# Patient Record
Sex: Male | Born: 1945
Health system: Southern US, Community
[De-identification: ages and names within clinical notes are randomized; demographics above are authoritative.]

## PROBLEM LIST (undated history)

## (undated) DIAGNOSIS — M1990 Unspecified osteoarthritis, unspecified site: Secondary | ICD-10-CM

## (undated) DIAGNOSIS — R42 Dizziness and giddiness: Secondary | ICD-10-CM

## (undated) DIAGNOSIS — I722 Aneurysm of renal artery: Secondary | ICD-10-CM

## (undated) DIAGNOSIS — Z9989 Dependence on other enabling machines and devices: Secondary | ICD-10-CM

## (undated) DIAGNOSIS — G4730 Sleep apnea, unspecified: Secondary | ICD-10-CM

## (undated) DIAGNOSIS — G4733 Obstructive sleep apnea (adult) (pediatric): Secondary | ICD-10-CM

## (undated) DIAGNOSIS — R7303 Prediabetes: Secondary | ICD-10-CM

## (undated) DIAGNOSIS — I10 Essential (primary) hypertension: Secondary | ICD-10-CM

## (undated) DIAGNOSIS — E785 Hyperlipidemia, unspecified: Secondary | ICD-10-CM

## (undated) DIAGNOSIS — I2510 Atherosclerotic heart disease of native coronary artery without angina pectoris: Secondary | ICD-10-CM

## (undated) HISTORY — DX: Essential (primary) hypertension: I10

## (undated) HISTORY — DX: Aneurysm of renal artery: I72.2

## (undated) HISTORY — PX: CORONARY STENT PLACEMENT: SHX1402

## (undated) HISTORY — PX: COLONOSCOPY: SHX174

## (undated) HISTORY — DX: Atherosclerotic heart disease of native coronary artery without angina pectoris: I25.10

## (undated) HISTORY — PX: WISDOM TOOTH EXTRACTION: SHX21

## (undated) HISTORY — DX: Sleep apnea, unspecified: G47.30

## (undated) HISTORY — DX: Dizziness and giddiness: R42

## (undated) HISTORY — DX: Obstructive sleep apnea (adult) (pediatric): G47.33

## (undated) HISTORY — PX: EYE SURGERY: SHX253

## (undated) HISTORY — DX: Dependence on other enabling machines and devices: Z99.89

## (undated) HISTORY — DX: Hyperlipidemia, unspecified: E78.5

---

## 1996-03-13 DIAGNOSIS — I2510 Atherosclerotic heart disease of native coronary artery without angina pectoris: Secondary | ICD-10-CM

## 1996-03-13 HISTORY — DX: Atherosclerotic heart disease of native coronary artery without angina pectoris: I25.10

## 2000-01-01 ENCOUNTER — Encounter: Payer: Self-pay | Admitting: Family Medicine

## 2000-01-01 ENCOUNTER — Encounter: Admission: RE | Admit: 2000-01-01 | Discharge: 2000-01-01 | Payer: Self-pay | Admitting: Family Medicine

## 2000-07-19 ENCOUNTER — Encounter: Payer: Self-pay | Admitting: Family Medicine

## 2000-07-19 ENCOUNTER — Encounter: Admission: RE | Admit: 2000-07-19 | Discharge: 2000-07-19 | Payer: Self-pay | Admitting: Family Medicine

## 2008-03-09 HISTORY — PX: DOPPLER ECHOCARDIOGRAPHY: SHX263

## 2008-03-09 HISTORY — PX: NM MYOCAR PERF WALL MOTION: HXRAD629

## 2008-07-13 DIAGNOSIS — G4733 Obstructive sleep apnea (adult) (pediatric): Secondary | ICD-10-CM

## 2008-07-13 DIAGNOSIS — I722 Aneurysm of renal artery: Secondary | ICD-10-CM

## 2008-07-13 HISTORY — DX: Aneurysm of renal artery: I72.2

## 2008-07-13 HISTORY — DX: Obstructive sleep apnea (adult) (pediatric): G47.33

## 2008-11-27 ENCOUNTER — Ambulatory Visit: Payer: Self-pay | Admitting: Vascular Surgery

## 2009-05-21 HISTORY — PX: OTHER SURGICAL HISTORY: SHX169

## 2010-04-04 ENCOUNTER — Encounter (INDEPENDENT_AMBULATORY_CARE_PROVIDER_SITE_OTHER): Payer: Self-pay | Admitting: *Deleted

## 2010-04-23 ENCOUNTER — Emergency Department (HOSPITAL_BASED_OUTPATIENT_CLINIC_OR_DEPARTMENT_OTHER): Admission: EM | Admit: 2010-04-23 | Discharge: 2010-04-23 | Payer: Self-pay | Admitting: Emergency Medicine

## 2010-05-08 ENCOUNTER — Ambulatory Visit: Payer: Self-pay | Admitting: Gastroenterology

## 2010-08-12 NOTE — Miscellaneous (Signed)
Summary: [facility] PV  Clinical Lists Changes   Reviewed pt's records.  Recall colon not due until 2013.  Colonoscopy for 05/19/2010 cancelled. Recall for 2013 ented into [facility]

## 2010-08-12 NOTE — Letter (Signed)
Summary: Pre Visit Letter Revised  [facility]  [address] [place], [place] [address]   Phone: [phone]  Fax: [phone]        04/04/2010 MRN: [id number] Evan [name] [address] [place], [place]  [address]             Procedure Date:  05/19/2010   Welcome to the Gastroenterology Division at [facility].    You are scheduled to see a nurse for your pre-procedure visit on 05/08/2010 at 8:30AM on the 3rd floor at [facility], [address]  We ask that you try to arrive at our office 15 minutes prior to your appointment time to allow for check-in.  Please take a minute to review the attached form.  If you answer "Yes" to one or more of the questions on the first page, we ask that you call the person listed at your earliest opportunity.  If you answer "No" to all of the questions, please complete the rest of the form and bring it to your appointment.    Your nurse visit will consist of discussing your medical and surgical history, your immediate family medical history, and your medications.   If you are unable to list all of your medications on the form, please bring the medication bottles to your appointment and we will list them.  We will need to be aware of both prescribed and over the counter drugs.  We will need to know exact dosage information as well.    Please be prepared to read and sign documents such as consent forms, a financial agreement, and acknowledgement forms.  If necessary, and with your consent, a friend or relative is welcome to sit-in on the nurse visit with you.  Please bring your insurance card so that we may make a copy of it.  If your insurance requires a referral to see a specialist, please bring your referral form from your primary care physician.  No co-pay is required for this nurse visit.     If you cannot keep your appointment, please call [phone] to cancel or reschedule prior to your appointment date.  This  allows [place] the opportunity to schedule an appointment for another patient in need of care.    Thank you for choosing [facility] for your medical needs.  We appreciate the opportunity to care for you.  Please visit [place] at our website  to learn more about our practice.  Sincerely, The Gastroenterology Division

## 2010-09-25 LAB — DIFFERENTIAL
Basophils Relative: 0 % (ref 0–1)
Eosinophils Absolute: 0 10*3/uL (ref 0.0–0.7)
Eosinophils Relative: 1 % (ref 0–5)
Lymphs Abs: 0.8 10*3/uL (ref 0.7–4.0)
Monocytes Absolute: 0.3 10*3/uL (ref 0.1–1.0)
Monocytes Relative: 7 % (ref 3–12)
Neutrophils Relative %: 77 % (ref 43–77)

## 2010-09-25 LAB — CBC
Hemoglobin: 16.4 g/dL (ref 13.0–17.0)
MCH: 32.9 pg (ref 26.0–34.0)
MCHC: 35.1 g/dL (ref 30.0–36.0)
MCV: 93.8 fL (ref 78.0–100.0)
RBC: 4.98 MIL/uL (ref 4.22–5.81)

## 2010-09-25 LAB — BASIC METABOLIC PANEL
BUN: 25 mg/dL — ABNORMAL HIGH (ref 6–23)
CO2: 28 mEq/L (ref 19–32)
Calcium: 9.5 mg/dL (ref 8.4–10.5)
Chloride: 100 mEq/L (ref 96–112)
Creatinine, Ser: 1 mg/dL (ref 0.4–1.5)
GFR calc Af Amer: >60 mL/min (ref >60)
GFR calc non Af Amer: >60 mL/min (ref >60)
Glucose, Bld: 109 mg/dL — ABNORMAL HIGH (ref 70–99)
Potassium: 6.1 mEq/L — ABNORMAL HIGH (ref 3.5–5.1)
Sodium: 138 mEq/L (ref 135–145)

## 2010-09-25 LAB — URINALYSIS, ROUTINE W REFLEX MICROSCOPIC
Bilirubin Urine: NEGATIVE
Glucose, UA: NEGATIVE mg/dL
Hgb urine dipstick: NEGATIVE
Ketones, ur: 15 mg/dL — AB
Leukocytes, UA: NEGATIVE
Nitrite: NEGATIVE
Protein, ur: 30 mg/dL — AB
Specific Gravity, Urine: 1.019 (ref 1.005–1.030)
Urobilinogen, UA: 0.2 mg/dL (ref 0.0–1.0)
pH: 7.5 (ref 5.0–8.0)

## 2010-09-25 LAB — URINE MICROSCOPIC-ADD ON

## 2010-11-25 NOTE — Consult Note (Signed)
VASCULAR SURGERY CONSULTATION   Evan [name], Evan [name]  DOB:  1945-10-23                                       11/27/2008  ZOXWR#:[id number]   I saw the patient in the office today in consultation concerning a right  renal artery aneurysm.  He was referred by Dr. Vernie Ammons.  This is a  pleasant 65 year old gentleman who had one episode of hematuria.  He was  evaluated by Dr. Vernie Ammons and part of his workup included a CT scan of  the abdomen.  This showed no evidence of renal calculi.  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  There was some laminated thrombus within the aneurysm.  CT scan  was otherwise unremarkable.   The patient has had no further episodes of hematuria and has had no  significant abdominal or flank pain.  He does have a history of  hypertension which has been stable.   PAST MEDICAL HISTORY:  Significant for hypertension and  hypercholesterolemia.  In addition, he has had angina in the past.  He  denies any history of diabetes, history of previous myocardial  infarction, history of congestive heart failure or history of COPD.   FAMILY HISTORY:  There is no history of premature cardiovascular  disease.   SOCIAL HISTORY:  He is married, has two children.  He is a Production designer, theatre/television/film at a  [employer].  He does not use tobacco.   ALLERGIES:  No known drug allergies.   MEDICATIONS:  1. Lipitor 10 mg p.o. q.h.[name].  2. Cozaar 100 mg p.o. q.a.m.  3. Lopressor 50 mg p.o. b.i.d.   REVIEW OF SYSTEMS:  GENERAL:  He has had no recent weight loss, weight  gain or problem with his appetite.  He has had no fever.  CARDIAC:  He has had no chest pain, chest pressure, palpitations or  arrhythmias.  PULMONARY:  He has had no productive cough, bronchitis, asthma or  wheezing.  GI:  He has had no recent changes in his bowel habits and has no history  of peptic ulcer disease.  GU:  He has had no dysuria or frequency.  He has  had no further  hematuria.  VASCULAR:  He has had no claudication, rest pain or nonhealing ulcers.  He has had no history of stroke, TIAs, expressive or receptive aphasia  or amaurosis fugax.  NEUROLOGICAL:  He has had no dizziness, blackouts, headaches or  seizures.  ORTHO:  He has had no arthritis, joint pain or muscle pain.  HEMATOLOGY:  He has had no bleeding problems or clotting disorders that  he is aware of.   PHYSICAL EXAMINATION:  General:  This is a pleasant 65 year old  gentleman who appears his stated age.  Vital signs:  Blood pressure is  176/98 on the left and 182/105 on the right, heart rate is 55.  HEENT:  Unremarkable.  Neck:  Supple.  There is no cervical lymphadenopathy.  I  do not detect any carotid bruits.  Lungs:  Clear bilaterally to  auscultation.  Cardiac:  He has a regular rate and rhythm.  Abdomen:  Soft and nontender.  He has normal pitched bowel sounds.  I cannot  appreciate any abdominal bruits.  He has palpable femoral and pedal  pulses bilaterally.  He has no  significant lower extremity swelling.  Neurological:  Exam is nonfocal.   I reviewed his CT scan which does show a right renal artery aneurysm  near the hilum of the right kidney.  The radiologist interpreted this as  1.9 x 2.5 cm although it is difficult to determine if the 2.5 cm  measurement is the length of the aneurysm.  I simply cannot determine  based on this study.   If the aneurysm is in fact 2.5 cm at maximum diameter then consideration  could be given to elective repair of this given the risk of rupture and  given that this could potentially have explained his episode of  hematuria and his part of his problem with hypertension.  I have  referred him to Dr. Salli Real at [facility] who has a very large experience with renal artery  reconstructions and given that this involves the distal right renal  artery I would recommend that he be evaluated  by Dr. Stevphen Rochester.  It may be  that he requires renal artery duplex or renal arteriography to further  evaluate this and rather than proceed with those studies here I prefer  to leave this to Dr. Stevphen Rochester so that we do not duplicate any workup.  If  the aneurysm truly only measures 1.9 cm in maximum diameter this could  potentially simply be followed but again I have asked for Dr. Kerry Kass  recommendations.  I would be happy to see him back if any new vascular  issues arise.   Di Kindle. Edilia Bo, M.D.  Electronically Signed  CSD/MEDQ  D:  11/27/2008  T:  11/28/2008  Job:  [id number]   cc:   Veverly Fells. Vernie Ammons, M.D.  Dr Cleda Clarks  Audrea Muscat, MD

## 2011-01-16 HISTORY — PX: NM MYOCAR PERF WALL MOTION: HXRAD629

## 2011-01-22 ENCOUNTER — Other Ambulatory Visit: Payer: Self-pay | Admitting: Cardiovascular Disease

## 2011-01-22 ENCOUNTER — Ambulatory Visit
Admission: RE | Admit: 2011-01-22 | Discharge: 2011-01-22 | Disposition: A | Discharge status: Home / Self Care | Payer: BC Managed Care – PPO | Source: Ambulatory Visit | Attending: Cardiovascular Disease | Admitting: Cardiovascular Disease

## 2011-01-29 ENCOUNTER — Ambulatory Visit (HOSPITAL_COMMUNITY)
Admission: RE | Admit: 2011-01-29 | Discharge: 2011-01-30 | Disposition: A | Discharge status: Home / Self Care | Payer: BC Managed Care – PPO | Source: Ambulatory Visit | Attending: Cardiovascular Disease | Admitting: Cardiovascular Disease

## 2011-01-29 DIAGNOSIS — Y840 Cardiac catheterization as the cause of abnormal reaction of the patient, or of later complication, without mention of misadventure at the time of the procedure: Secondary | ICD-10-CM | POA: Insufficient documentation

## 2011-01-29 DIAGNOSIS — IMO0002 Reserved for concepts with insufficient information to code with codable children: Secondary | ICD-10-CM | POA: Insufficient documentation

## 2011-01-29 DIAGNOSIS — I739 Peripheral vascular disease, unspecified: Secondary | ICD-10-CM | POA: Insufficient documentation

## 2011-01-29 DIAGNOSIS — Z7982 Long term (current) use of aspirin: Secondary | ICD-10-CM | POA: Insufficient documentation

## 2011-01-29 DIAGNOSIS — Z01818 Encounter for other preprocedural examination: Secondary | ICD-10-CM | POA: Insufficient documentation

## 2011-01-29 DIAGNOSIS — I2510 Atherosclerotic heart disease of native coronary artery without angina pectoris: Secondary | ICD-10-CM | POA: Insufficient documentation

## 2011-01-29 DIAGNOSIS — E785 Hyperlipidemia, unspecified: Secondary | ICD-10-CM | POA: Insufficient documentation

## 2011-01-29 DIAGNOSIS — I10 Essential (primary) hypertension: Secondary | ICD-10-CM | POA: Insufficient documentation

## 2011-01-30 LAB — CBC
HCT: 44.9 % (ref 39.0–52.0)
Hemoglobin: 15.8 g/dL (ref 13.0–17.0)
MCH: 32.1 pg (ref 26.0–34.0)
MCHC: 35.2 g/dL (ref 30.0–36.0)
MCV: 91.3 fL (ref 78.0–100.0)
RDW: 12.8 % (ref 11.5–15.5)

## 2011-01-30 LAB — BASIC METABOLIC PANEL
BUN: 16 mg/dL (ref 6–23)
Calcium: 9.2 mg/dL (ref 8.4–10.5)
Creatinine, Ser: 1.07 mg/dL (ref 0.50–1.35)
GFR calc Af Amer: >60 mL/min (ref >60)
GFR calc non Af Amer: >60 mL/min (ref >60)
Glucose, Bld: 125 mg/dL — ABNORMAL HIGH (ref 70–99)
Potassium: 3.9 mEq/L (ref 3.5–5.1)

## 2011-02-04 NOTE — Cardiovascular Report (Signed)
NAME:  Evan [name], Evan [name] NO.:  [id number]  MEDICAL RECORD NO.:  [id number]  LOCATION:  6524                         FACILITY:  [facility]  PHYSICIAN:  Nanetta Batty, M.D.   DATE OF BIRTH:  Mar 06, 1946  DATE OF PROCEDURE:  01/29/2011 DATE OF DISCHARGE:                           CARDIAC CATHETERIZATION   Evan [name] is a very pleasant 65 year old thin-appearing, married Asian male, father of two, grandfather of one grandchild, who works as a Production designer, theatre/television/film at [employer].  He was initially referred to me because of hypertension, hyperlipidemia to be established because of known CAD.  He did have an LAD stent placed by Dr. Nicki Guadalajara on April 07, 1996, [place]).  I saw him recently on January 22, 2011. He had a Myoview in August 2009, that showed subtle septal ischemia, which we decided to follow medically.  He also has a renal artery aneurysm which was surgically corrected by Dr. Steele Berg at [facility].  He saw Dr. Clarene Duke in the office 3 weeks ago because of dizziness and hypertension.  He does exercise on a daily basis.  He also complains of "an uncomfortable pressure feeling in his chest occurring several times a month."  Myoview performed on January 16, 2011, shows mild-to-moderate septal ischemia.  Because of this, he presents now for outpatient diagnostic coronary arteriography to define his anatomy and rule out ischemic an etiology.  PROCEDURE DESCRIPTION:  The patient was brought to the Second [facility] in the postabsorptive state.  He was premedicated with p.o. Valium, IV Versed, IV fentanyl.  His right wrist and groin were prepped and shaved in usual sterile fashion.  Xylocaine 1% was used for local anesthesia.  Attempts were made to access right radial artery unsuccessfully and therefore, the right femoral artery was accessed using standard Seldinger technique with a 5-French sheath.  5- French right  and left Judkins diagnostic catheters as well as 5-French pigtail catheter were used for selective coronary angiography and left ventriculography respectively.  Visipaque dye was used for the entirety of the case.  Retrograde aorta, left ventricular, and pullback pressures were recorded.  HEMODYNAMICS: 1. Aortic systolic pressure 130, diastolic pressure 70. 2. Left ventricular systolic pressure 131, end-diastolic pressure 10. 3. Selective coronary angiography:     a.     Left main normal.     b.     LAD; LAD had a fairly focal eccentric proximal 95% stenosis      at the first septal perforator.  The mid LAD stent was widely      patent with at most 30% diffuse "in-stent restenosis."  The      moderate-sized diagonal branch which arose in the midportion of      the LAD had a 50%-70% ostial stenosis. 4. Left circumflex; nondominant with 80% hypodense segmental mid AV     groove stenosis. 5. Right coronary artery; dominant with anterior takeoff and free of     significant disease. 6. Left ventriculography; RAO left ventriculogram was performed using     25 mL of Visipaque dye at 12 mL per second.  The overall LVEF was  estimated at greater than 60% without focal wall motion     abnormalities.  IMPRESSION:  Evan [name] has a patent mid left anterior descending artery stent placed 15 years ago with new lesions in the proximal left anterior descending artery and mid atriovenous groove circumflex.  We will proceed with PCI and stenting using Effient since the patient has a PLAVIX allergy and Angiomax.  PROCEDURE DESCRIPTION:  This 5-French sheath was exchanged over a wire for a 6-French sheath.  The patient received Angiomax bolus with an ACT of 351.  Total contrast used during the case was 175 mL.  Using a 6-French XB LAD 3.5 guide catheter along with an 0.014 x 190 Asahi soft wire and 2.0 x 12 sprinter balloon, a predilatation was performed on the LAD.  The proximal LAD lesion was  occlusive with simply the balloon without it being inflated.  Stenting was performed with a 3.0 x 18 Resolute drug-eluting stent at 16 atmospheres and postdilatation with a 3.2 x 5, 15 [place] sprinter (3.46 mm at 17 atmospheres) resulting in reduction of a 95% fairly focal proximal LAD lesion to 0% residual.  The wire was then withdrawn and directed down the circumflex. Intracoronary nitroglycerin 200 mcg was given.  The lesion was predilated with the same 2.5 x 12 sprinter and stenting was performed with a 2.5 x 14 Medtronic Resolute drug-eluting stent at 14 atmospheres (2.7 mm) resulting in reduction of 80% stenosis to 0% residual.  The patient tolerated the procedure well without hemodynamic or electrocardiographic sequelae.  IMPRESSION:  Successful two-vessel percutaneous coronary intervention and stenting of the proximal left anterior descending artery and mid atrioventricular groove circumflex with Resolute drug-eluting stents, aspirin, Effient, and Angiomax.  Guidewire and catheter were removed. The sheath was sewn securely in place.  The patient left the lab in stable condition.  He will be gently hydrated overnight and dischargedhome in the morning with aspirin and Effient.  I will see him back in the office in 1 week and 3 weeks for followup.     Nanetta Batty, M.D.     JB/MEDQ  D:  01/29/2011  T:  01/29/2011  Job:  [id number]  cc:   Cardiac Catheterization Laboratory [facility] Dr. Theadora Rama  Electronically Signed by Nanetta Batty M.D. on 02/04/2011 03:20:32 PM

## 2011-02-27 NOTE — Discharge Summary (Signed)
Evan [name], Evan [name]                 ACCOUNT NO.:  [id number]  MEDICAL RECORD NO.:  [id number]  LOCATION:  6524                         FACILITY:  [facility]  PHYSICIAN:  Nanetta Batty, M.D.   DATE OF BIRTH:  July 09, 1946  DATE OF ADMISSION:  01/29/2011 DATE OF DISCHARGE:  01/30/2011                              DISCHARGE SUMMARY   DISCHARGE DIAGNOSES: 1. Coronary artery disease status post left heart catheterization     which showed a patent left anterior descending stent.  Also     received new proximal left anterior descending and mid     arteriovenous groove circumflex stents. 2. Hypertension, treated. 3. Hyperlipidemia, treated.  HOSPITAL COURSE:  Evan [name] is a 65 year old male with a history of coronary artery disease, hypertension, hyperlipidemia, and renal artery aneurysm which was surgically corrected by Dr. Karleen Dolphin at [facility].  Evan [name] is status post LAD stent placed by Dr. Tresa Endo in September 1997.  He had recently complained of dizziness, hypertension, and uncomfortable pressure and feeling in his chest which occurred several times a month.  He had a Myoview performed on January 16, 2011 which showed mild-to-moderate septal ischemia.  Specifically, he was sent for left heart catheterization as an outpatient.  This revealed a 95% blockage in the proximal LAD as well as the mid AV groove circumflex blockage of 80% and a patent mid LAD stent.  Subsequently, he received stent to the proximal LAD and mid AV groove.  They were DES stents.  Currently, the patient is stable without complaints.  He has been seen by Dr. Allyson Sabal who feels he is ready for discharge home on aspirin and Effient with followup.  DISCHARGE LABORATORY DATA:  WBCs 7.1, hemoglobin 15.8, hematocrit 44.9, and platelets 156.  Sodium 141, potassium 3.9, chloride 104, carbon dioxide 29, BUN 16, creatinine 1.07, glucose 125, and calcium 9.2.  STUDIES/PROCEDURES:  Left heart catheterization.   Successful two-vessel percutaneous coronary intervention and stent in the proximal left anterior descending artery and mid atrioventricular groove circumflex with Resolute drug-eluting stents.  DISCHARGE MEDICATIONS: 1. Aspirin 325 mg 1 tablet by mouth daily. 2. Prasugrel 10 mg 1 tablet by mouth daily. 3. Cozaar 100 mg 1 tablet by mouth daily. 4. Fish oil over-the-counter 1 capsule by mouth daily. 5. Lipitor 10 mg 1 tablet by mouth every evening. 6. Lopressor 25 mg 1 tablet by mouth twice a day. 7. Multivitamins 1 tablet by mouth daily. 8. Norvasc 5 mg 1/2 tablet by mouth twice daily. 9. Vitamin E over-the-counter 1 capsule by mouth daily.  DISPOSITION:  Evan [name] will be discharged home in stable condition.  It was recommended that he returns to work no sooner than February 09, 2011. It was recommended that he increase his activity slowly.  He may shower and bathe.  No lifting greater than 5 pounds for 3 days.  No driving for 3 days.  He should also eat a heart-healthy diet.  If catheter site becomes red, painful, swollen, or discharges fluid or pus, he will call our office.  He will follow with Dr. Allyson Sabal on February 11, 2011 at 9:00 a.m. and he will be seeing Cardiac Rehab in  the upcoming weeks.    ______________________________ Wilburt Finlay, PA   ______________________________ Nanetta Batty, M.D.    BH/MEDQ  D:  01/30/2011  T:  01/30/2011  Job:  [id number]  cc:   Theadora Rama, M.D.  Electronically Signed by Wilburt Finlay PA on 02/09/2011 02:04:53 PM Electronically Signed by Nanetta Batty M.D. on 02/27/2011 03:32:50 PM

## 2011-03-30 ENCOUNTER — Encounter (HOSPITAL_COMMUNITY)
Admission: RE | Admit: 2011-03-30 | Discharge: 2011-03-30 | Disposition: A | Discharge status: Home / Self Care | Payer: BC Managed Care – PPO | Source: Ambulatory Visit | Attending: Cardiovascular Disease | Admitting: Cardiovascular Disease

## 2011-03-30 DIAGNOSIS — Z7982 Long term (current) use of aspirin: Secondary | ICD-10-CM | POA: Insufficient documentation

## 2011-03-30 DIAGNOSIS — I739 Peripheral vascular disease, unspecified: Secondary | ICD-10-CM | POA: Insufficient documentation

## 2011-03-30 DIAGNOSIS — E785 Hyperlipidemia, unspecified: Secondary | ICD-10-CM | POA: Insufficient documentation

## 2011-03-30 DIAGNOSIS — I2510 Atherosclerotic heart disease of native coronary artery without angina pectoris: Secondary | ICD-10-CM | POA: Insufficient documentation

## 2011-03-30 DIAGNOSIS — I10 Essential (primary) hypertension: Secondary | ICD-10-CM | POA: Insufficient documentation

## 2011-03-30 DIAGNOSIS — Z9861 Coronary angioplasty status: Secondary | ICD-10-CM | POA: Insufficient documentation

## 2011-03-30 DIAGNOSIS — Z5189 Encounter for other specified aftercare: Secondary | ICD-10-CM | POA: Insufficient documentation

## 2011-04-01 ENCOUNTER — Encounter (HOSPITAL_COMMUNITY): Payer: BC Managed Care – PPO

## 2011-04-03 ENCOUNTER — Encounter (HOSPITAL_COMMUNITY): Payer: BC Managed Care – PPO

## 2011-04-06 ENCOUNTER — Encounter (HOSPITAL_COMMUNITY): Payer: BC Managed Care – PPO

## 2011-04-08 ENCOUNTER — Encounter (HOSPITAL_COMMUNITY): Payer: BC Managed Care – PPO

## 2011-04-10 ENCOUNTER — Encounter (HOSPITAL_COMMUNITY): Payer: BC Managed Care – PPO

## 2011-04-13 ENCOUNTER — Encounter (HOSPITAL_COMMUNITY): Payer: BC Managed Care – PPO | Attending: Cardiovascular Disease

## 2011-04-13 DIAGNOSIS — I2510 Atherosclerotic heart disease of native coronary artery without angina pectoris: Secondary | ICD-10-CM | POA: Insufficient documentation

## 2011-04-13 DIAGNOSIS — I10 Essential (primary) hypertension: Secondary | ICD-10-CM | POA: Insufficient documentation

## 2011-04-13 DIAGNOSIS — Z7982 Long term (current) use of aspirin: Secondary | ICD-10-CM | POA: Insufficient documentation

## 2011-04-13 DIAGNOSIS — Z9861 Coronary angioplasty status: Secondary | ICD-10-CM | POA: Insufficient documentation

## 2011-04-13 DIAGNOSIS — I739 Peripheral vascular disease, unspecified: Secondary | ICD-10-CM | POA: Insufficient documentation

## 2011-04-13 DIAGNOSIS — Z5189 Encounter for other specified aftercare: Secondary | ICD-10-CM | POA: Insufficient documentation

## 2011-04-13 DIAGNOSIS — E785 Hyperlipidemia, unspecified: Secondary | ICD-10-CM | POA: Insufficient documentation

## 2011-04-15 ENCOUNTER — Encounter (HOSPITAL_COMMUNITY): Payer: BC Managed Care – PPO

## 2011-04-17 ENCOUNTER — Encounter (HOSPITAL_COMMUNITY): Payer: BC Managed Care – PPO

## 2011-04-20 ENCOUNTER — Encounter (HOSPITAL_COMMUNITY): Payer: BC Managed Care – PPO

## 2011-04-22 ENCOUNTER — Encounter (HOSPITAL_COMMUNITY): Payer: BC Managed Care – PPO

## 2011-04-24 ENCOUNTER — Encounter (HOSPITAL_COMMUNITY): Payer: BC Managed Care – PPO

## 2011-04-27 ENCOUNTER — Encounter (HOSPITAL_COMMUNITY): Payer: BC Managed Care – PPO

## 2011-04-29 ENCOUNTER — Encounter (HOSPITAL_COMMUNITY): Payer: BC Managed Care – PPO

## 2011-05-01 ENCOUNTER — Encounter (HOSPITAL_COMMUNITY): Payer: BC Managed Care – PPO

## 2011-05-04 ENCOUNTER — Encounter (HOSPITAL_COMMUNITY): Payer: BC Managed Care – PPO

## 2011-05-06 ENCOUNTER — Encounter (HOSPITAL_COMMUNITY): Payer: BC Managed Care – PPO

## 2011-05-08 ENCOUNTER — Encounter (HOSPITAL_COMMUNITY): Payer: BC Managed Care – PPO

## 2011-05-11 ENCOUNTER — Encounter (HOSPITAL_COMMUNITY): Payer: BC Managed Care – PPO

## 2011-05-13 ENCOUNTER — Encounter (HOSPITAL_COMMUNITY): Payer: BC Managed Care – PPO

## 2011-05-15 ENCOUNTER — Encounter (HOSPITAL_COMMUNITY): Payer: BC Managed Care – PPO

## 2011-05-18 ENCOUNTER — Encounter (HOSPITAL_COMMUNITY): Payer: BC Managed Care – PPO

## 2011-05-20 ENCOUNTER — Encounter (HOSPITAL_COMMUNITY): Payer: BC Managed Care – PPO

## 2011-05-22 ENCOUNTER — Encounter (HOSPITAL_COMMUNITY): Payer: BC Managed Care – PPO

## 2011-05-25 ENCOUNTER — Encounter (HOSPITAL_COMMUNITY): Payer: BC Managed Care – PPO

## 2011-05-27 ENCOUNTER — Encounter (HOSPITAL_COMMUNITY): Payer: BC Managed Care – PPO

## 2011-05-29 ENCOUNTER — Encounter (HOSPITAL_COMMUNITY): Payer: BC Managed Care – PPO

## 2011-06-01 ENCOUNTER — Encounter (HOSPITAL_COMMUNITY): Payer: BC Managed Care – PPO

## 2011-06-03 ENCOUNTER — Encounter (HOSPITAL_COMMUNITY): Payer: BC Managed Care – PPO

## 2011-06-05 ENCOUNTER — Encounter (HOSPITAL_COMMUNITY): Payer: BC Managed Care – PPO

## 2011-06-08 ENCOUNTER — Encounter (HOSPITAL_COMMUNITY): Payer: BC Managed Care – PPO

## 2011-06-10 ENCOUNTER — Encounter (HOSPITAL_COMMUNITY): Payer: BC Managed Care – PPO

## 2011-06-12 ENCOUNTER — Encounter (HOSPITAL_COMMUNITY): Payer: BC Managed Care – PPO

## 2011-06-15 ENCOUNTER — Encounter (HOSPITAL_COMMUNITY): Payer: BC Managed Care – PPO

## 2011-06-17 ENCOUNTER — Encounter (HOSPITAL_COMMUNITY): Payer: BC Managed Care – PPO

## 2011-06-19 ENCOUNTER — Encounter (HOSPITAL_COMMUNITY): Payer: BC Managed Care – PPO

## 2011-06-22 ENCOUNTER — Encounter (HOSPITAL_COMMUNITY): Payer: BC Managed Care – PPO

## 2011-06-24 ENCOUNTER — Encounter (HOSPITAL_COMMUNITY): Payer: BC Managed Care – PPO

## 2011-06-26 ENCOUNTER — Encounter (HOSPITAL_COMMUNITY): Payer: BC Managed Care – PPO

## 2011-06-29 ENCOUNTER — Encounter (HOSPITAL_COMMUNITY): Payer: BC Managed Care – PPO

## 2011-07-01 ENCOUNTER — Encounter (HOSPITAL_COMMUNITY): Payer: BC Managed Care – PPO

## 2011-07-03 ENCOUNTER — Encounter (HOSPITAL_COMMUNITY): Payer: BC Managed Care – PPO

## 2011-09-25 DIAGNOSIS — I10 Essential (primary) hypertension: Secondary | ICD-10-CM | POA: Diagnosis not present

## 2011-09-25 DIAGNOSIS — E782 Mixed hyperlipidemia: Secondary | ICD-10-CM | POA: Diagnosis not present

## 2011-09-25 DIAGNOSIS — I2510 Atherosclerotic heart disease of native coronary artery without angina pectoris: Secondary | ICD-10-CM | POA: Diagnosis not present

## 2011-10-13 DIAGNOSIS — E785 Hyperlipidemia, unspecified: Secondary | ICD-10-CM | POA: Diagnosis not present

## 2011-10-13 DIAGNOSIS — I2510 Atherosclerotic heart disease of native coronary artery without angina pectoris: Secondary | ICD-10-CM | POA: Diagnosis not present

## 2011-10-13 DIAGNOSIS — Z125 Encounter for screening for malignant neoplasm of prostate: Secondary | ICD-10-CM | POA: Diagnosis not present

## 2011-10-13 DIAGNOSIS — I10 Essential (primary) hypertension: Secondary | ICD-10-CM | POA: Diagnosis not present

## 2011-11-29 IMAGING — CR DG CHEST 2V
2 series · 2 of 2 positions shown · non-contrast
Comparison: None.

CLINICAL DATA: Chest pain.  Precardiac catheterization.

CHEST - 2 VIEW

[view not recorded (1 of 2)]
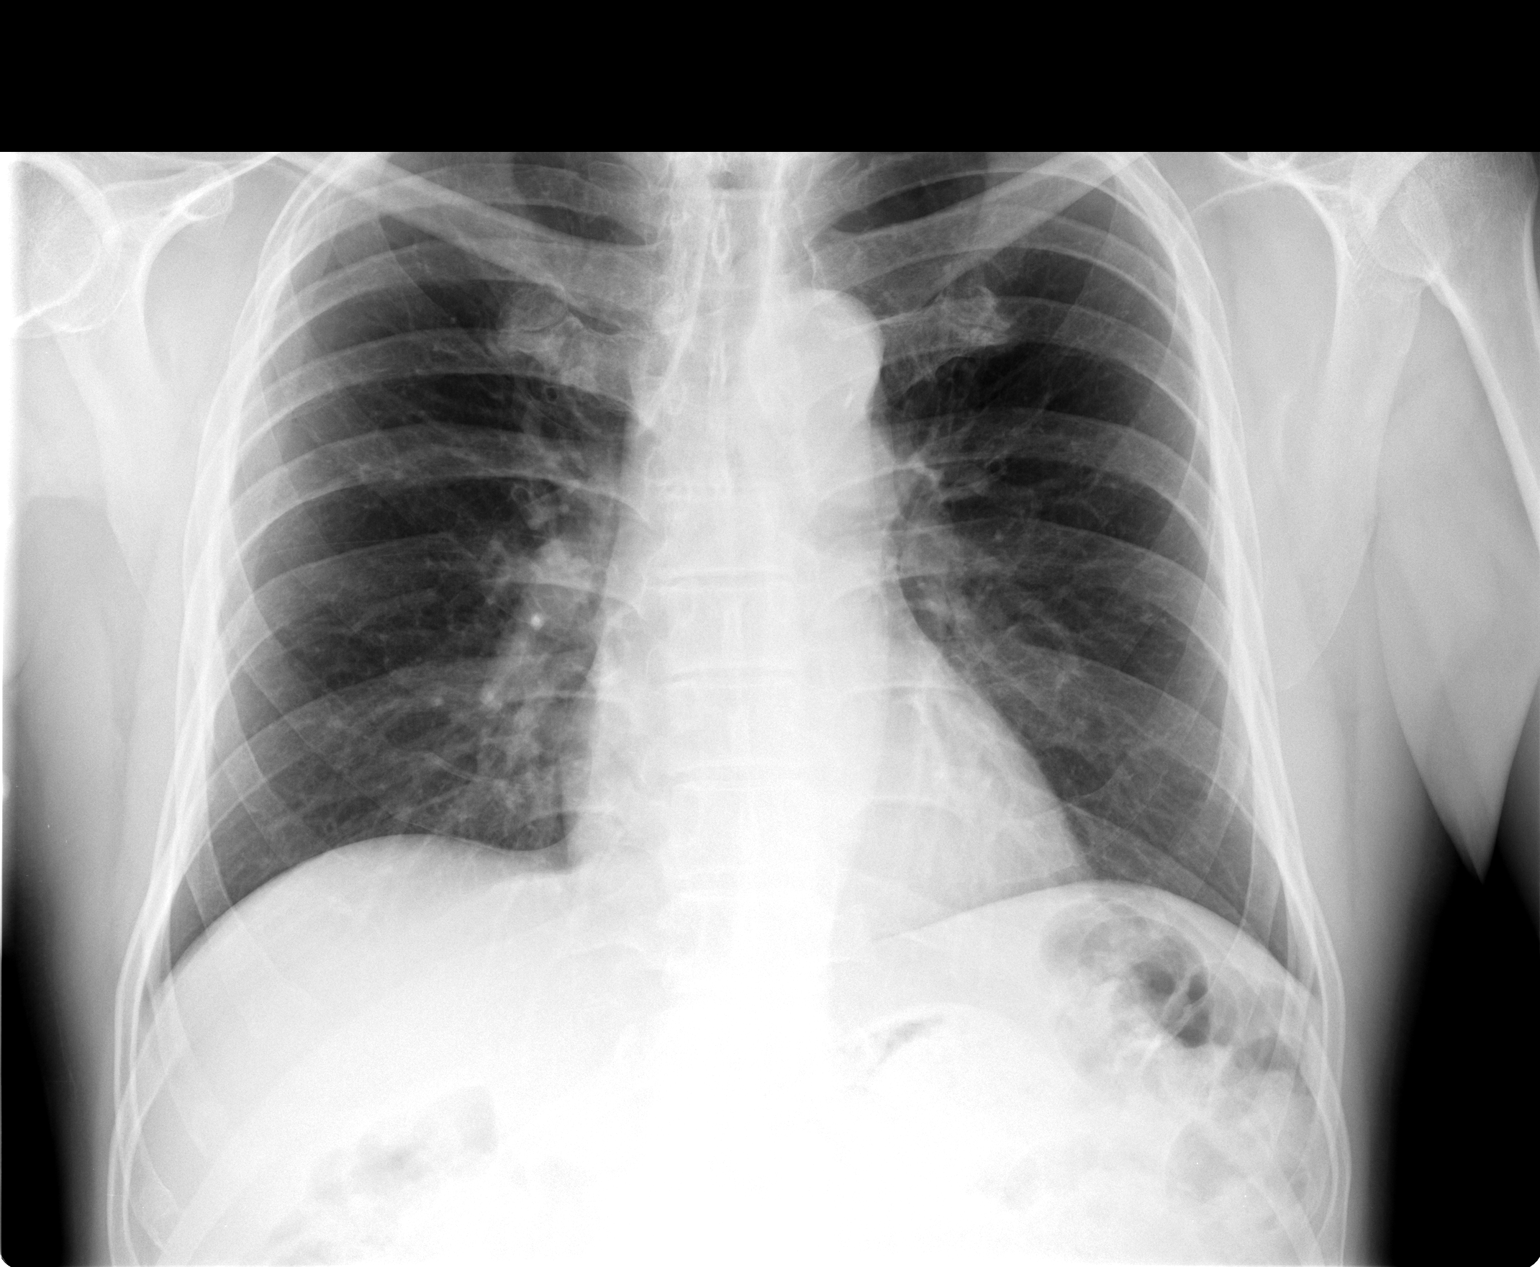

[view not recorded (2 of 2)]
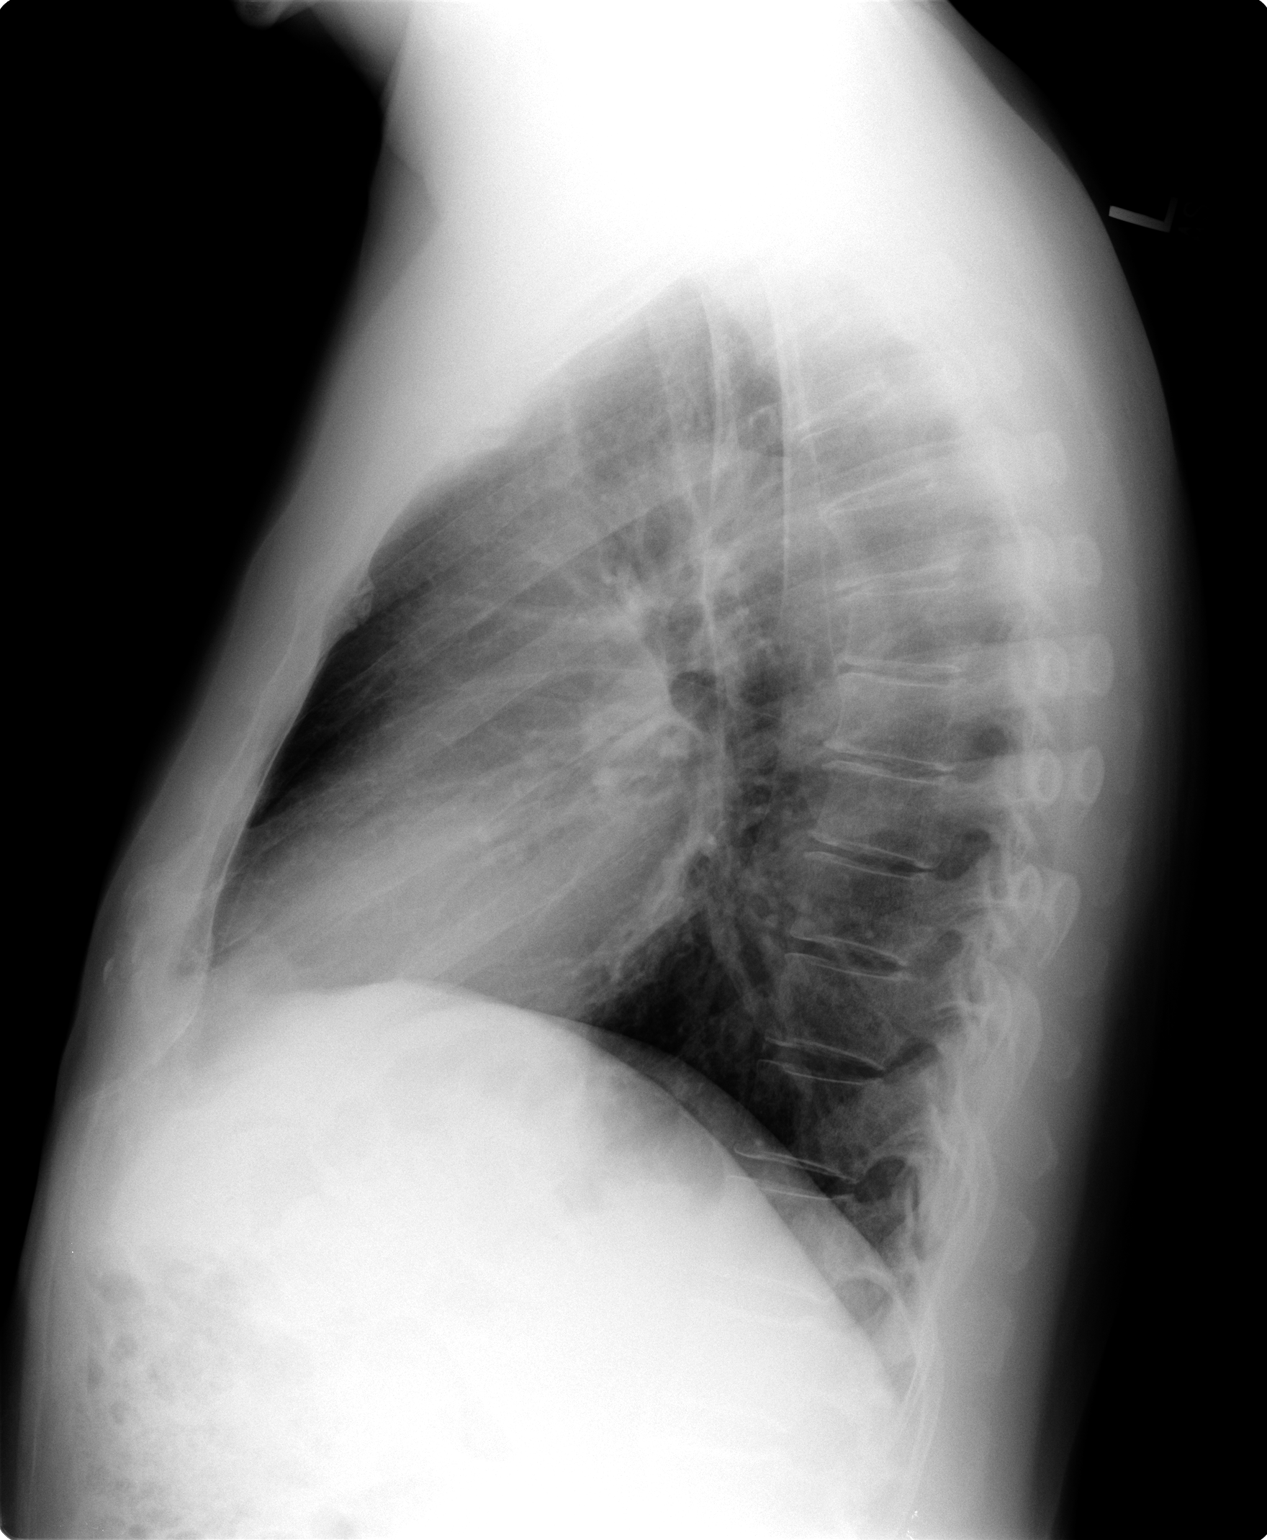

[2 of 2 positions shown; findings below may reference images not displayed]

FINDINGS: The heart size and mediastinal contours are normal.  The
lungs are clear.  There is no pleural effusion or pneumothorax.
Osteophytes are noted at the sternomanubrial junction.
IMPRESSION: No active cardiopulmonary process.

## 2012-02-15 DIAGNOSIS — Q619 Cystic kidney disease, unspecified: Secondary | ICD-10-CM | POA: Diagnosis not present

## 2012-02-15 DIAGNOSIS — I10 Essential (primary) hypertension: Secondary | ICD-10-CM | POA: Diagnosis not present

## 2012-02-15 DIAGNOSIS — E785 Hyperlipidemia, unspecified: Secondary | ICD-10-CM | POA: Diagnosis not present

## 2012-02-15 DIAGNOSIS — I2510 Atherosclerotic heart disease of native coronary artery without angina pectoris: Secondary | ICD-10-CM | POA: Diagnosis not present

## 2012-02-24 DIAGNOSIS — E785 Hyperlipidemia, unspecified: Secondary | ICD-10-CM | POA: Diagnosis not present

## 2012-02-24 DIAGNOSIS — I10 Essential (primary) hypertension: Secondary | ICD-10-CM | POA: Diagnosis not present

## 2012-02-24 DIAGNOSIS — Z125 Encounter for screening for malignant neoplasm of prostate: Secondary | ICD-10-CM | POA: Diagnosis not present

## 2012-03-21 DIAGNOSIS — Z23 Encounter for immunization: Secondary | ICD-10-CM | POA: Diagnosis not present

## 2012-03-21 DIAGNOSIS — I2510 Atherosclerotic heart disease of native coronary artery without angina pectoris: Secondary | ICD-10-CM | POA: Diagnosis not present

## 2012-03-21 DIAGNOSIS — E785 Hyperlipidemia, unspecified: Secondary | ICD-10-CM | POA: Diagnosis not present

## 2012-03-21 DIAGNOSIS — I10 Essential (primary) hypertension: Secondary | ICD-10-CM | POA: Diagnosis not present

## 2012-03-21 DIAGNOSIS — N182 Chronic kidney disease, stage 2 (mild): Secondary | ICD-10-CM | POA: Diagnosis not present

## 2012-03-23 DIAGNOSIS — I10 Essential (primary) hypertension: Secondary | ICD-10-CM | POA: Diagnosis not present

## 2012-03-23 DIAGNOSIS — E782 Mixed hyperlipidemia: Secondary | ICD-10-CM | POA: Diagnosis not present

## 2012-03-23 DIAGNOSIS — I2510 Atherosclerotic heart disease of native coronary artery without angina pectoris: Secondary | ICD-10-CM | POA: Diagnosis not present

## 2012-03-30 DIAGNOSIS — H43819 Vitreous degeneration, unspecified eye: Secondary | ICD-10-CM | POA: Diagnosis not present

## 2012-04-28 ENCOUNTER — Encounter: Payer: Self-pay | Admitting: Gastroenterology

## 2012-06-02 ENCOUNTER — Encounter: Payer: Self-pay | Admitting: Gastroenterology

## 2012-06-15 DIAGNOSIS — Z23 Encounter for immunization: Secondary | ICD-10-CM | POA: Diagnosis not present

## 2012-06-29 ENCOUNTER — Ambulatory Visit (AMBULATORY_SURGERY_CENTER): Payer: Medicare Other

## 2012-06-29 VITALS — Ht 71.0 in | Wt 167.4 lb

## 2012-06-29 DIAGNOSIS — Z1211 Encounter for screening for malignant neoplasm of colon: Secondary | ICD-10-CM | POA: Diagnosis not present

## 2012-06-29 MED ORDER — MOVIPREP 100 G PO SOLR: ORAL | Status: DC

## 2012-07-11 DIAGNOSIS — H43819 Vitreous degeneration, unspecified eye: Secondary | ICD-10-CM | POA: Diagnosis not present

## 2012-07-11 DIAGNOSIS — H35419 Lattice degeneration of retina, unspecified eye: Secondary | ICD-10-CM | POA: Diagnosis not present

## 2012-07-20 ENCOUNTER — Encounter: Payer: Self-pay | Admitting: Gastroenterology

## 2012-07-20 ENCOUNTER — Ambulatory Visit (AMBULATORY_SURGERY_CENTER): Payer: Medicare Other | Admitting: Gastroenterology

## 2012-07-20 VITALS — BP 130/66 | HR 60 | Temp 96.8°F | Resp 34 | Ht 71.0 in | Wt 167.0 lb

## 2012-07-20 DIAGNOSIS — Z1211 Encounter for screening for malignant neoplasm of colon: Secondary | ICD-10-CM

## 2012-07-20 DIAGNOSIS — R42 Dizziness and giddiness: Secondary | ICD-10-CM | POA: Diagnosis not present

## 2012-07-20 DIAGNOSIS — D126 Benign neoplasm of colon, unspecified: Secondary | ICD-10-CM

## 2012-07-20 DIAGNOSIS — I10 Essential (primary) hypertension: Secondary | ICD-10-CM | POA: Diagnosis not present

## 2012-07-20 MED ORDER — SODIUM CHLORIDE 0.9 % IV SOLN: 500.0000 mL | INTRAVENOUS | Status: DC

## 2012-07-20 NOTE — Progress Notes (Signed)
Called to room to assist during endoscopic procedure.  Patient ID and intended procedure confirmed with present staff. Received instructions for my participation in the procedure from the performing physician.  

## 2012-07-20 NOTE — Progress Notes (Signed)
11.03 In Pacu, vss, comfortable, a/ox3, report to [employer]

## 2012-07-20 NOTE — Op Note (Signed)
[facility] [address] [place], [address]   COLONOSCOPY PROCEDURE REPORT  PATIENT: Evan [name], Evan [name]  MR#: [id number] BIRTHDATE: October 03, 1945 , 66  yrs. old GENDER: Male ENDOSCOPIST: Mardella Layman, MD, [facility] REFERRED BY: PROCEDURE DATE:  07/20/2012 PROCEDURE:   Colonoscopy with snare polypectomy ASA CLASS:   Class II INDICATIONS:Average risk patient for colon cancer. MEDICATIONS: Propofol (Diprivan) 180 mg IV  DESCRIPTION OF PROCEDURE:   After the risks and benefits and of the procedure were explained, informed consent was obtained.  A digital rectal exam revealed no abnormalities of the rectum.    The LB CF-H180AL [id number]  endoscope was introduced through the anus and advanced to the cecum, which was identified by both the appendix and ileocecal valve .  The quality of the prep was excellent, using MoviPrep .  The instrument was then slowly withdrawn as the colon was fully examined.     COLON FINDINGS: A smooth flat polyp ranging between 5-13mm in size was found at the cecum.  A polypectomy was performed with a cold snare.  The resection was complete and the polyp tissue was completely retrieved.     Retroflexed views revealed external hemorrhoids.     The scope was then withdrawn from the patient and the procedure completed  .Exam otherwisw unremarkable without mucosal or polypoid lesions.  COMPLICATIONS: There were no complications. ENDOSCOPIC IMPRESSION: Flat polyp ranging between 5-78mm in size was found at the cecum; polypectomy was performed with a cold snare  RECOMMENDATIONS: 1.  Await pathology results 2.  Repeat Colonoscopy in 3 years.   REPEAT EXAM:  XB:[id number], Arlys John MD  _______________________________ eSigned:  Mardella Layman, MD, [facility] 07/20/2012 11:02 AM

## 2012-07-20 NOTE — Progress Notes (Signed)
Patient did not experience any of the following events: a burn prior to discharge; a fall within the facility; wrong site/side/patient/procedure/implant event; or a hospital transfer or hospital admission upon discharge from the facility. (G8907) Patient did not have preoperative order for IV antibiotic SSI prophylaxis. (G8918)  

## 2012-07-20 NOTE — Patient Instructions (Addendum)
Discharge instructions given with verbal understanding. Handout on polyps given. Resume previous medications. YOU HAD AN ENDOSCOPIC PROCEDURE TODAY AT THE [facility]: Refer to the procedure report that was given to you for any specific questions about what was found during the examination.  If the procedure report does not answer your questions, please call your gastroenterologist to clarify.  If you requested that your care partner not be given the details of your procedure findings, then the procedure report has been included in a sealed envelope for you to review at your convenience later.  YOU SHOULD EXPECT: Some feelings of bloating in the abdomen. Passage of more gas than usual.  Walking can help get rid of the air that was put into your GI tract during the procedure and reduce the bloating. If you had a lower endoscopy (such as a colonoscopy or flexible sigmoidoscopy) you may notice spotting of blood in your stool or on the toilet paper. If you underwent a bowel prep for your procedure, then you may not have a normal bowel movement for a few days.  DIET: Your first meal following the procedure should be a light meal and then it is ok to progress to your normal diet.  A half-sandwich or bowl of soup is an example of a good first meal.  Heavy or fried foods are harder to digest and may make you feel nauseous or bloated.  Likewise meals heavy in dairy and vegetables can cause extra gas to form and this can also increase the bloating.  Drink plenty of fluids but you should avoid alcoholic beverages for 24 hours.  ACTIVITY: Your care partner should take you home directly after the procedure.  You should plan to take it easy, moving slowly for the rest of the day.  You can resume normal activity the day after the procedure however you should NOT DRIVE or use heavy machinery for 24 hours (because of the sedation medicines used during the test).    SYMPTOMS TO REPORT IMMEDIATELY: A  gastroenterologist can be reached at any hour.  During normal business hours, 8:30 AM to 5:00 PM Monday through Friday, call [phone].  After hours and on weekends, please call the GI answering service at [phone] who will take a message and have the physician on call contact you.   Following lower endoscopy (colonoscopy or flexible sigmoidoscopy):  Excessive amounts of blood in the stool  Significant tenderness or worsening of abdominal pains  Swelling of the abdomen that is new, acute  Fever of 100F or higher  FOLLOW UP: If any biopsies were taken you will be contacted by phone or by letter within the next 1-3 weeks.  Call your gastroenterologist if you have not heard about the biopsies in 3 weeks.  Our staff will call the home number listed on your records the next business day following your procedure to check on you and address any questions or concerns that you may have at that time regarding the information given to you following your procedure. This is a courtesy call and so if there is no answer at the home number and we have not heard from you through the emergency physician on call, we will assume that you have returned to your regular daily activities without incident.  SIGNATURES/CONFIDENTIALITY: You and/or your care partner have signed paperwork which will be entered into your electronic medical record.  These signatures attest to the fact that that the information above on your After Visit Summary has   been reviewed and is understood.  Full responsibility of the confidentiality of this discharge information lies with you and/or your care-partner. 

## 2012-07-21 ENCOUNTER — Telehealth: Payer: Self-pay

## 2012-07-21 NOTE — Telephone Encounter (Signed)
Left a message on the pt's answering machine at # [phone] to call us back if any questions or concerns.  Maw

## 2012-07-26 ENCOUNTER — Encounter: Payer: Self-pay | Admitting: Gastroenterology

## 2012-08-22 DIAGNOSIS — H35419 Lattice degeneration of retina, unspecified eye: Secondary | ICD-10-CM | POA: Diagnosis not present

## 2012-08-22 DIAGNOSIS — H43819 Vitreous degeneration, unspecified eye: Secondary | ICD-10-CM | POA: Diagnosis not present

## 2012-09-28 DIAGNOSIS — E782 Mixed hyperlipidemia: Secondary | ICD-10-CM | POA: Diagnosis not present

## 2012-09-28 DIAGNOSIS — I2510 Atherosclerotic heart disease of native coronary artery without angina pectoris: Secondary | ICD-10-CM | POA: Diagnosis not present

## 2012-09-28 DIAGNOSIS — I10 Essential (primary) hypertension: Secondary | ICD-10-CM | POA: Diagnosis not present

## 2012-10-06 DIAGNOSIS — I10 Essential (primary) hypertension: Secondary | ICD-10-CM | POA: Diagnosis not present

## 2012-10-06 DIAGNOSIS — E785 Hyperlipidemia, unspecified: Secondary | ICD-10-CM | POA: Diagnosis not present

## 2012-10-07 DIAGNOSIS — R319 Hematuria, unspecified: Secondary | ICD-10-CM | POA: Diagnosis not present

## 2012-10-13 DIAGNOSIS — I10 Essential (primary) hypertension: Secondary | ICD-10-CM | POA: Diagnosis not present

## 2012-10-13 DIAGNOSIS — I2510 Atherosclerotic heart disease of native coronary artery without angina pectoris: Secondary | ICD-10-CM | POA: Diagnosis not present

## 2012-10-13 DIAGNOSIS — N182 Chronic kidney disease, stage 2 (mild): Secondary | ICD-10-CM | POA: Diagnosis not present

## 2012-10-13 DIAGNOSIS — E785 Hyperlipidemia, unspecified: Secondary | ICD-10-CM | POA: Diagnosis not present

## 2012-10-25 DIAGNOSIS — R310 Gross hematuria: Secondary | ICD-10-CM | POA: Diagnosis not present

## 2012-10-25 DIAGNOSIS — N401 Enlarged prostate with lower urinary tract symptoms: Secondary | ICD-10-CM | POA: Diagnosis not present

## 2013-03-26 ENCOUNTER — Encounter: Payer: Self-pay | Admitting: *Deleted

## 2013-03-29 ENCOUNTER — Encounter: Payer: Self-pay | Admitting: Cardiovascular Disease

## 2013-03-30 ENCOUNTER — Encounter: Payer: Self-pay | Admitting: Cardiology

## 2013-03-30 ENCOUNTER — Ambulatory Visit (INDEPENDENT_AMBULATORY_CARE_PROVIDER_SITE_OTHER): Payer: Medicare Other | Admitting: Cardiology

## 2013-03-30 VITALS — BP 156/80 | HR 57 | Ht 71.0 in | Wt 163.2 lb

## 2013-03-30 DIAGNOSIS — I10 Essential (primary) hypertension: Secondary | ICD-10-CM

## 2013-03-30 DIAGNOSIS — I2510 Atherosclerotic heart disease of native coronary artery without angina pectoris: Secondary | ICD-10-CM

## 2013-03-30 DIAGNOSIS — I739 Peripheral vascular disease, unspecified: Secondary | ICD-10-CM

## 2013-03-30 DIAGNOSIS — E785 Hyperlipidemia, unspecified: Secondary | ICD-10-CM | POA: Diagnosis not present

## 2013-03-30 NOTE — Assessment & Plan Note (Signed)
LDL 78 March 2014

## 2013-03-30 NOTE — Patient Instructions (Signed)
Your physician recommends that you continue on your current medications as directed. Please refer to the Current Medication list given to you today. Your physician recommends that you schedule a follow-up appointment in: 6  Months with Dr Allyson Sabal

## 2013-03-30 NOTE — Assessment & Plan Note (Signed)
No angina 

## 2013-03-30 NOTE — Assessment & Plan Note (Signed)
B/P a little high today but he says it well controlled at home

## 2013-03-30 NOTE — Assessment & Plan Note (Signed)
He was followed up with a CT at two years and released by his surgeon

## 2013-03-30 NOTE — Progress Notes (Signed)
03/30/2013 Evan [name]   11-Nov-1945  [id number]  Primary Physicia Thayer Headings, MD Primary Cardiologist: Dr Allyson Sabal  HPI:  The patient is a very pleasant 67 year old, thin-appearing, married Asian male, father of 2, grandfather to 1 grandchild who is retired from [employer]. His wife is a Teacher, early years/pre at [employer]. He has a history of CAD status post LAD stenting by Dr. Daphene Jaeger April 07, 1996, with a J&J TS-1530 stent. 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 Tonny Bollman at [facility] in 2010. He did have a follow up evaluation there 2 yrs later and was released from their service.. Because of complaints of an uncomfortable pressure sensation in his chest, a Myoview stress test performed January 16, 2011, showed mild to moderate septal ischemia. He was catheterized January 29, 2011, revealing high-grade proximal LAD as well as AV groove circumflex disease, both of which were stented with Resolute drug-eluting stents. His previously placed LAD stent was widely patent. He has been asymptomatic since. His other problems include hypertension and hyperlipidemia. Dr. Thea Silversmith follows his lipid profile. His last LDL was 78 March 2014. He had complained about some pain in his ankles at his last office visit. The pt was concerned it was from statin Rx. Dr berry added CoQ10 and the pt feels this has alleviated that issue. He denies any anginal symptoms. He walks every day for exercise.     Current Outpatient Prescriptions  Medication Sig Dispense Refill  . amLODipine (NORVASC) 5 MG tablet Take 5 mg by mouth daily.      Marland Kitchen aspirin 81 MG tablet Take 81 mg by mouth daily.      Marland Kitchen atorvastatin (LIPITOR) 20 MG tablet Take 20 mg by mouth daily.      . Coenzyme Q10 (COQ10 PO) Take by mouth.      Marland Kitchen glucosamine-chondroitin 500-400 MG tablet Take 1 tablet by mouth 3 (three) times daily.      Marland Kitchen losartan (COZAAR) 100 MG tablet Take 100 mg by mouth  daily.      . metoprolol tartrate (LOPRESSOR) 25 MG tablet Take 25 mg by mouth 2 (two) times daily.      . Calcium Carb-Cholecalciferol (CALCIUM 1000 + D PO) Take by mouth daily.      . metoprolol (LOPRESSOR) 50 MG tablet Take 50 mg by mouth daily.      . Multiple Vitamin (MULTIVITAMIN) tablet Take 1 tablet by mouth daily.      . Omega-3 Fatty Acids (FISH OIL) 1200 MG CAPS Take by mouth daily.      . vitamin E (VITAMIN E) 400 UNIT capsule Take 400 Units by mouth daily.       No current facility-administered medications for this visit.    Allergies  Allergen Reactions  . Plavix [Clopidogrel Bisulfate]     Unknown reaction    History   Social History  . Marital Status: Married    Spouse Name: N/A    Number of Children: N/A  . Years of Education: N/A   Occupational History  . Not on file.   Social History Main Topics  . Smoking status: Never Smoker   . Smokeless tobacco: Never Used  . Alcohol Use: No  . Drug Use: No  . Sexual Activity: Not on file   Other Topics Concern  . Not on file   Social History Narrative  . No narrative on file     Review of Systems: General: negative  for chills, fever, night sweats or weight changes.  Cardiovascular: negative for chest pain, dyspnea on exertion, edema, orthopnea, palpitations, paroxysmal nocturnal dyspnea or shortness of breath Dermatological: negative for rash Respiratory: negative for cough or wheezing Urologic: negative for hematuria Abdominal: negative for nausea, vomiting, diarrhea, bright red blood per rectum, melena, or hematemesis Neurologic: negative for visual changes, syncope, or dizziness All other systems reviewed and are otherwise negative except as noted above.    Blood pressure 156/80, pulse 57, height 5\' 11"  (1.803 m), weight 163 lb 3.2 oz (74.027 kg).  General appearance: alert, cooperative and no distress Neck: no carotid bruit and no JVD Lungs: clear to auscultation bilaterally Heart: regular rate and  rhythm Abdomen: Rt surgical scar  EKG NSR,SB, repol changes  ASSESSMENT AND PLAN:   CAD - LAD PCI '97, LAD/CFX DES 7/12 No angina  PVD renal artery aneurysm bypass [facility]) 2011 He was followed up with a CT at two years and released by his surgeon  HTN (hypertension) B/P a little high today but he says it well controlled at home  Dyslipidemia LDL 78 March 2014   PLAN  Same Rx. He will follow up in 6 months with Dr Allyson Sabal. He asked about repeating a Myoview and I told him I did not think that was indicated at this time. I gave him an Rx for CoQ10 and Glucosamine.  Soriah Leeman KPA-C 03/30/2013 10:29 AM

## 2013-04-13 DIAGNOSIS — Z125 Encounter for screening for malignant neoplasm of prostate: Secondary | ICD-10-CM | POA: Diagnosis not present

## 2013-04-13 DIAGNOSIS — Z0000 Encounter for general adult medical examination without abnormal findings: Secondary | ICD-10-CM | POA: Diagnosis not present

## 2013-04-13 DIAGNOSIS — R7301 Impaired fasting glucose: Secondary | ICD-10-CM | POA: Diagnosis not present

## 2013-04-13 DIAGNOSIS — Z23 Encounter for immunization: Secondary | ICD-10-CM | POA: Diagnosis not present

## 2013-04-13 DIAGNOSIS — I10 Essential (primary) hypertension: Secondary | ICD-10-CM | POA: Diagnosis not present

## 2013-04-20 ENCOUNTER — Encounter: Payer: Self-pay | Admitting: Cardiovascular Disease

## 2013-04-20 DIAGNOSIS — I10 Essential (primary) hypertension: Secondary | ICD-10-CM | POA: Diagnosis not present

## 2013-04-20 DIAGNOSIS — I2510 Atherosclerotic heart disease of native coronary artery without angina pectoris: Secondary | ICD-10-CM | POA: Diagnosis not present

## 2013-04-20 DIAGNOSIS — R7301 Impaired fasting glucose: Secondary | ICD-10-CM | POA: Diagnosis not present

## 2013-04-20 DIAGNOSIS — E785 Hyperlipidemia, unspecified: Secondary | ICD-10-CM | POA: Diagnosis not present

## 2013-09-26 ENCOUNTER — Ambulatory Visit (INDEPENDENT_AMBULATORY_CARE_PROVIDER_SITE_OTHER): Payer: Medicare Other | Admitting: Cardiovascular Disease

## 2013-09-26 ENCOUNTER — Encounter: Payer: Self-pay | Admitting: Cardiovascular Disease

## 2013-09-26 VITALS — BP 160/100 | HR 52 | Ht 70.5 in | Wt 174.0 lb

## 2013-09-26 DIAGNOSIS — E785 Hyperlipidemia, unspecified: Secondary | ICD-10-CM

## 2013-09-26 DIAGNOSIS — I2510 Atherosclerotic heart disease of native coronary artery without angina pectoris: Secondary | ICD-10-CM

## 2013-09-26 DIAGNOSIS — I10 Essential (primary) hypertension: Secondary | ICD-10-CM

## 2013-09-26 NOTE — Progress Notes (Signed)
09/26/2013 Evan [name]   [birth year]  [id number]  Primary Physician Thressa Sheller, MD Primary Cardiologist: Lorretta Harp MD Renae Gloss   HPI:  The patient is a very pleasant 68 year old, thin-appearing, married Asian male, father of 2, grandfather to 1 grandchild who is retired from [employer]. His wife is a Software engineer at [employer]. He has a history of CAD status post LAD stenting by Dr. Ellouise Newer April 07, 1996, with a J&J PS-1530 stent. 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 Kennith Gain at [facility] in 2010. He did have a follow up evaluation there 2 yrs later and was released from their service.. Because of complaints of an uncomfortable pressure sensation in his chest, a Myoview stress test performed January 16, 2011, showed mild to moderate septal ischemia. He was catheterized January 29, 2011, revealing high-grade proximal LAD as well as AV groove circumflex disease, both of which were stented with Resolute drug-eluting stents. His previously placed LAD stent was widely patent. He has been asymptomatic since. His other problems include hypertension and hyperlipidemia. Dr. Noah Delaine follows his lipid profile. His last LDL was 78 March 2014. He had complained about some pain in his ankles at his last office visit. The pt was concerned it was from statin Rx. Dr berry added CoQ10 and the pt feels this has alleviated that issue. He denies any anginal symptoms. He walks every day for exercise.    Current Outpatient Prescriptions  Medication Sig Dispense Refill  . amLODipine (NORVASC) 5 MG tablet Take 5 mg by mouth daily.      Marland Kitchen aspirin 81 MG tablet Take 81 mg by mouth daily.      Marland Kitchen atorvastatin (LIPITOR) 20 MG tablet Take 20 mg by mouth daily.      . Calcium Carb-Cholecalciferol (CALCIUM 1000 + D PO) Take by mouth daily.      . Coenzyme Q10 (COQ10 PO) Take by mouth.      Marland Kitchen glucosamine-chondroitin  500-400 MG tablet Take 1 tablet by mouth 3 (three) times daily.      Marland Kitchen losartan (COZAAR) 100 MG tablet Take 100 mg by mouth daily.      . metoprolol tartrate (LOPRESSOR) 25 MG tablet Take 25 mg by mouth 2 (two) times daily.      . Multiple Vitamin (MULTIVITAMIN) tablet Take 1 tablet by mouth daily.      . Omega-3 Fatty Acids (FISH OIL) 1200 MG CAPS Take by mouth daily.       No current facility-administered medications for this visit.    Allergies  Allergen Reactions  . Plavix [Clopidogrel Bisulfate]     Unknown reaction    History   Social History  . Marital Status: Married    Spouse Name: N/A    Number of Children: N/A  . Years of Education: N/A   Occupational History  . Not on file.   Social History Main Topics  . Smoking status: Never Smoker   . Smokeless tobacco: Never Used  . Alcohol Use: No  . Drug Use: No  . Sexual Activity: Not on file   Other Topics Concern  . Not on file   Social History Narrative  . No narrative on file     Review of Systems: General: negative for chills, fever, night sweats or weight changes.  Cardiovascular: negative for chest pain, dyspnea on exertion, edema, orthopnea, palpitations, paroxysmal nocturnal dyspnea or shortness of breath Dermatological: negative  for rash Respiratory: negative for cough or wheezing Urologic: negative for hematuria Abdominal: negative for nausea, vomiting, diarrhea, bright red blood per rectum, melena, or hematemesis Neurologic: negative for visual changes, syncope, or dizziness All other systems reviewed and are otherwise negative except as noted above.    Blood pressure 160/100, pulse 52, height 5' 10.5" (1.791 m), weight 174 lb (78.926 kg).  General appearance: alert and no distress Neck: no adenopathy, no carotid bruit, no JVD, supple, symmetrical, trachea midline and thyroid not enlarged, symmetric, no tenderness/mass/nodules Lungs: clear to auscultation bilaterally Heart: regular rate and  rhythm, S1, S2 normal, no murmur, click, rub or gallop Extremities: extremities normal, atraumatic, no cyanosis or edema  EKG sinus bradycardia at 52 without ST or T wave changes  ASSESSMENT AND PLAN:   CAD - LAD PCI '97, LAD/CFX DES 7/12 History of known CAD status post remote LAD stenting by Dr. Ellouise Newer 04/07/96 with a J&J PS 1530 stent.I catheterized him 01/29/11 revealing a patent distal LAD stent. I placed 2 resolute drug-eluting stents in the proximal LAD and mid AV groove circumflex. His LV function was normal. He has had no chest pain since. His last Myoview performed 01/16/11 with a nonischemic.  Dyslipidemia On statin therapy followed by her PCP  HTN (hypertension) The blood pressure is elevated today which he attributes to lack of sleep. He is on appropriate medications. His last blood pressure 6 months ago was at the upper range of normal.      Lorretta Harp MD [facility], [facility] 09/26/2013 9:47 AM

## 2013-09-26 NOTE — Patient Instructions (Signed)
Your physician recommends that you schedule a follow-up appointment in 6 months with Kerin Ransom, PA-C.  Dr Gwenlyn Found wants you to follow-up in 12 months. You will receive a reminder letter in the mail two months in advance. If you don't receive a letter, please call our office to schedule the follow-up appointment.

## 2013-09-26 NOTE — Assessment & Plan Note (Signed)
The blood pressure is elevated today which he attributes to lack of sleep. He is on appropriate medications. His last blood pressure 6 months ago was at the upper range of normal.

## 2013-09-26 NOTE — Assessment & Plan Note (Signed)
History of known CAD status post remote LAD stenting by Dr. Ellouise Newer 04/07/96 with a J&J PS 1530 stent.I catheterized him 01/29/11 revealing a patent distal LAD stent. I placed 2 resolute drug-eluting stents in the proximal LAD and mid AV groove circumflex. His LV function was normal. He has had no chest pain since. His last Myoview performed 01/16/11 with a nonischemic.

## 2013-09-26 NOTE — Assessment & Plan Note (Signed)
On statin therapy followed by her PCP 

## 2013-09-27 DIAGNOSIS — H43819 Vitreous degeneration, unspecified eye: Secondary | ICD-10-CM | POA: Diagnosis not present

## 2013-09-27 DIAGNOSIS — H35419 Lattice degeneration of retina, unspecified eye: Secondary | ICD-10-CM | POA: Diagnosis not present

## 2013-09-28 DIAGNOSIS — I10 Essential (primary) hypertension: Secondary | ICD-10-CM | POA: Diagnosis not present

## 2013-09-28 DIAGNOSIS — R7301 Impaired fasting glucose: Secondary | ICD-10-CM | POA: Diagnosis not present

## 2013-10-03 DIAGNOSIS — I10 Essential (primary) hypertension: Secondary | ICD-10-CM | POA: Diagnosis not present

## 2013-10-03 DIAGNOSIS — E785 Hyperlipidemia, unspecified: Secondary | ICD-10-CM | POA: Diagnosis not present

## 2013-10-03 DIAGNOSIS — I2510 Atherosclerotic heart disease of native coronary artery without angina pectoris: Secondary | ICD-10-CM | POA: Diagnosis not present

## 2013-10-03 DIAGNOSIS — R7301 Impaired fasting glucose: Secondary | ICD-10-CM | POA: Diagnosis not present

## 2013-10-27 ENCOUNTER — Ambulatory Visit: Payer: Self-pay | Admitting: Podiatrist

## 2013-11-10 ENCOUNTER — Encounter: Payer: Self-pay | Admitting: Podiatrist

## 2013-11-10 ENCOUNTER — Telehealth: Payer: Self-pay | Admitting: *Deleted

## 2013-11-10 ENCOUNTER — Ambulatory Visit (INDEPENDENT_AMBULATORY_CARE_PROVIDER_SITE_OTHER): Payer: Medicare Other | Admitting: Podiatrist

## 2013-11-10 VITALS — BP 160/78 | HR 60 | Resp 12

## 2013-11-10 DIAGNOSIS — B351 Tinea unguium: Secondary | ICD-10-CM | POA: Diagnosis not present

## 2013-11-10 DIAGNOSIS — I2510 Atherosclerotic heart disease of native coronary artery without angina pectoris: Secondary | ICD-10-CM | POA: Diagnosis not present

## 2013-11-10 DIAGNOSIS — L608 Other nail disorders: Secondary | ICD-10-CM

## 2013-11-10 NOTE — Telephone Encounter (Signed)
Mycotic Toenail fragment was sent to [facility] for a fungal culture/ pas stain.  [facility] [address] [place] [address] Ph: [phone]

## 2013-11-10 NOTE — Progress Notes (Signed)
   Subjective:    Patient ID: Evan [name], male    DOB: August 31, 1945, 68 y.o.   MRN: [id number]  HPI  PT STATED RT FOOT 2ND TOENAIL HAVE DISCOLORATION AND KEEP GROWING BACK FOR 3 YEARS. THE TOENAIL IS GETTING WORSE. TRIED TO USED ANTIFUNGAL BUT DID NOT HELP.   Review of Systems  All other systems reviewed and are negative.      Objective:   Physical Exam Patient is awake, alert, and oriented x 3.  In no acute distress.  Neurovascular status is intact with palpable pedal pulses at 2/4 DP and PT bilateral and capillary refill time within normal limits. Neurological sensation is also intact bilaterally both epicritically and protectively. Dermatological exam reveals skin color, turger and texture as normal. No open lesions present.   Musculoskeletal examination reveals good muscle, strength, tone and stability. Musculature intact with dorsiflexion, plantarflexion, inversion, eversion. Right 2nd toenail is thick and discolored.  It appears dystrophic and mycotic as well.  It is uncomfortable to the patient.  Remainder of the toenails are asymptomatic     Assessment & Plan:  Mycotic nail vs. Dystrophy  Plan:  Took a sample of the toenail for culture.  Will notify patient of result and we will decide on treatment options based on the result.  Also discussed we could remove the nail permanently if necessary as well.  Will follow up with culture.

## 2013-12-06 ENCOUNTER — Telehealth: Payer: Self-pay | Admitting: *Deleted

## 2013-12-06 DIAGNOSIS — Z79899 Other long term (current) drug therapy: Secondary | ICD-10-CM

## 2013-12-06 MED ORDER — ITRACONAZOLE 200 MG PO TABS: 200.0000 mg | ORAL_TABLET | Freq: Every day | ORAL | Status: DC

## 2013-12-06 NOTE — Telephone Encounter (Signed)
Prescription for Onmel was e-scribed and Hepatic Function was ordered.  Patient coming to pick up lab.

## 2013-12-06 NOTE — Telephone Encounter (Signed)
Need clarification on his prescription that was e-scribed.  I returned her call.  She asked if it was okay to switch the patient to Itraconazole because of the cost of the Onmel.  I told her that's fine. She said the Itraconazole on comes in 100mg  so they'll have to give him 60 instead to take 2 once a day.  She then stated that the Itraconazole needed authorization.  Please call [phone], patient number is [phone].  She asked what I wanted them to tell the patient.  I told her to tell him we're working on the authorization.

## 2013-12-06 NOTE — Telephone Encounter (Signed)
Came in couple weeks ago about my foot.  I have not heard anything from her or the lab results.  I called and informed the patient that we just got the lab results.  It is positive for fungus and mold.  She said we can start you on some medication but you need to have a liver function test first.  I told him I would go ahead and e-scribe his prescription but he will have to come by the office to pick up lab request.  He said he will come by tomorrow to pick it up because he's going out of town next week.

## 2013-12-07 ENCOUNTER — Other Ambulatory Visit: Payer: Self-pay | Admitting: Podiatrist

## 2013-12-07 DIAGNOSIS — Z79899 Other long term (current) drug therapy: Secondary | ICD-10-CM | POA: Diagnosis not present

## 2013-12-07 LAB — HEPATIC FUNCTION PANEL
ALBUMIN: 4.5 g/dL (ref 3.5–5.2)
ALT: 26 U/L (ref 0–53)
AST: 20 U/L (ref 0–37)
Alkaline Phosphatase: 78 U/L (ref 39–117)
Bilirubin, Direct: 0.1 mg/dL (ref 0.0–0.3)
Indirect Bilirubin: 0.5 mg/dL (ref 0.2–1.2)
Total Bilirubin: 0.6 mg/dL (ref 0.2–1.2)
Total Protein: 6.9 g/dL (ref 6.0–8.3)

## 2013-12-08 ENCOUNTER — Telehealth: Payer: Self-pay | Admitting: *Deleted

## 2013-12-08 NOTE — Telephone Encounter (Signed)
I called and informed Evan [name] that the Itraconazole has been approved.  Approval date is from 12/08/2013 to 03/02/2014.  She stated the cost is $311.

## 2013-12-08 NOTE — Telephone Encounter (Signed)
Spoke to [place].   Answered all her questions to get authorization for Itraconazole.  She said the authorization was approved, will fax over approval letter.

## 2013-12-08 NOTE — Telephone Encounter (Signed)
I attempted to call and let her know the prescription had been authorized by insurance.  She stated he had changed locations for [facility].  She asked me to call [phone] to give authorization to them.

## 2013-12-11 ENCOUNTER — Telehealth: Payer: Self-pay | Admitting: *Deleted

## 2013-12-11 NOTE — Telephone Encounter (Signed)
I returned his call.  He said he was concerned about the medication that Dr. Valentina Lucks prescribed.  It is too expensive.  There is a possible drug interaction with some medicines I take.  I take Lipitor, Norvasc and Losartan.  I have a heart condition.  Is there anything else she can prescribe.  I explained to him maybe a topical.  He stated he would rather go that route.  I told him I would check with Dr. Valentina Lucks but she is not in the office this week.  He stated that is fine.

## 2013-12-11 NOTE — Telephone Encounter (Signed)
She prescribed me a medication.  I think it's quite expensive.  Is there any other medication that can replace that?  Give me a call back.  I'm outside [facility] waiting for a nurse or doctor to give me a call back.

## 2013-12-11 NOTE — Telephone Encounter (Signed)
Earlier nurse called me.  We got disconnected.  I want to talk a little bit more.  I returned his call.  He stated that if she wants to prescribe anything call it in to a pharmacy in [place] because that's where I will be next week.  It's a 6 hour time difference.

## 2013-12-11 NOTE — Telephone Encounter (Signed)
Sure-- call in Evan [name] to whatever pharmacy he would like in [place]-- warn him that it can also be expensive without a coupon.  He may be able to go onto the internet and print out a coupon on the [employer].. I haven't tried it but its worth a look.  thanks

## 2013-12-12 MED ORDER — EFINACONAZOLE 10 % EX SOLN: 1.0000 "application " | Freq: Every day | CUTANEOUS | Status: DC

## 2013-12-12 NOTE — Telephone Encounter (Signed)
I called and left the patient a message that we are going to e-scribe a prescription for Jublia.  It will be sent to a company called [facility].  [place].  They will be contacting you in regards to insurance.  Please answer when they call.  Medication will be delivered to your home.  Please call if you have any questions.

## 2013-12-15 ENCOUNTER — Encounter: Payer: Self-pay | Admitting: Podiatrist

## 2013-12-29 ENCOUNTER — Encounter: Payer: Self-pay | Admitting: Podiatrist

## 2014-01-04 ENCOUNTER — Telehealth: Payer: Self-pay | Admitting: *Deleted

## 2014-01-04 NOTE — Telephone Encounter (Signed)
I left the patent a message to call and give [place] his address..I called the prescription into [place] at [phone] for Jublia 10% solution, apply to nails topically daily, qty. 31ml, 3 refills per Dr. Valentina Lucks.  If it's too expensive we will try [place] again but I need his address.

## 2014-01-05 ENCOUNTER — Other Ambulatory Visit: Payer: Self-pay | Admitting: Podiatrist

## 2014-01-05 MED ORDER — TAVABOROLE 5 % EX SOLN: 1.0000 [drp] | CUTANEOUS | Status: DC

## 2014-03-16 DIAGNOSIS — I10 Essential (primary) hypertension: Secondary | ICD-10-CM | POA: Diagnosis not present

## 2014-03-16 DIAGNOSIS — R7301 Impaired fasting glucose: Secondary | ICD-10-CM | POA: Diagnosis not present

## 2014-03-21 DIAGNOSIS — E785 Hyperlipidemia, unspecified: Secondary | ICD-10-CM | POA: Diagnosis not present

## 2014-03-21 DIAGNOSIS — R7301 Impaired fasting glucose: Secondary | ICD-10-CM | POA: Diagnosis not present

## 2014-03-21 DIAGNOSIS — I10 Essential (primary) hypertension: Secondary | ICD-10-CM | POA: Diagnosis not present

## 2014-03-21 DIAGNOSIS — I2510 Atherosclerotic heart disease of native coronary artery without angina pectoris: Secondary | ICD-10-CM | POA: Diagnosis not present

## 2014-03-21 DIAGNOSIS — Z23 Encounter for immunization: Secondary | ICD-10-CM | POA: Diagnosis not present

## 2014-05-15 ENCOUNTER — Encounter: Payer: Self-pay | Admitting: Cardiovascular Disease

## 2014-05-15 ENCOUNTER — Ambulatory Visit (INDEPENDENT_AMBULATORY_CARE_PROVIDER_SITE_OTHER): Payer: Medicare Other | Admitting: Cardiovascular Disease

## 2014-05-15 VITALS — BP 130/92 | HR 57 | Ht 71.0 in | Wt 170.5 lb

## 2014-05-15 DIAGNOSIS — I2510 Atherosclerotic heart disease of native coronary artery without angina pectoris: Secondary | ICD-10-CM

## 2014-05-15 DIAGNOSIS — I10 Essential (primary) hypertension: Secondary | ICD-10-CM | POA: Diagnosis not present

## 2014-05-15 DIAGNOSIS — E785 Hyperlipidemia, unspecified: Secondary | ICD-10-CM | POA: Diagnosis not present

## 2014-05-15 DIAGNOSIS — I2583 Coronary atherosclerosis due to lipid rich plaque: Principal | ICD-10-CM

## 2014-05-15 NOTE — Patient Instructions (Signed)
Dr Berry recommends that you schedule a follow-up appointment in 6 months. You will receive a reminder letter in the mail two months in advance. If you don't receive a letter, please call our office to schedule the follow-up appointment. 

## 2014-05-15 NOTE — Assessment & Plan Note (Signed)
On statin therapy with recent lipid profile performed by his PCP 03/16/14 revealing a total cholesterol 114, LDL of 58 and HDL of 45

## 2014-05-15 NOTE — Assessment & Plan Note (Signed)
History of CAD status post LAD stenting by Dr. Claiborne Billings 04/07/96 with a [place] PS-1530 stent. Because of a uncomfortable sensation in his chest a Myoview stress test performed 01/16/11 showed mild to moderate septal ischemia. I catheterized him 01/29/11 revealing high-grade proximal LAD and AV groove circumflex disease both of which I stented with resolute drug-eluting stents. His previously placed LAD stent was widely patent. He has remained asymptomatic since.

## 2014-05-15 NOTE — Progress Notes (Signed)
05/15/2014 Albertine Patricia Osmer   September 10, 1945  [id number]  Primary Physician Thressa Sheller, MD Primary Cardiologist: Lorretta Harp MD Renae Gloss   HPI:  The patient is a very pleasant 68 year old, thin-appearing, married Asian male, father of 2, grandfather to 1 grandchild who is retired from [employer]. I last saw him 09/26/13. His wife is a Software engineer at [employer]. He has a history of CAD status post LAD stenting by Dr. Ellouise Newer April 07, 1996, with a J&J PS-1530 stent. 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 Kennith Gain at [facility] in 2010. He did have a follow up evaluation there 2 yrs later and was released from their service.. Because of complaints of an uncomfortable pressure sensation in his chest, a Myoview stress test performed January 16, 2011, showed mild to moderate septal ischemia. He was catheterized January 29, 2011, revealing high-grade proximal LAD as well as AV groove circumflex disease, both of which were stented with Resolute drug-eluting stents. His previously placed LAD stent was widely patent. He has been asymptomatic since. His other problems include hypertension and hyperlipidemia. Dr. Noah Delaine follows his lipid profile. His most recent lipid profile performed by his PCP 03/16/14 revealed a total cholesterol of 114, LDL of 58 and HDL 45 since I saw him last he denies chest pain or shortness of breath. He does travel quite a bit recently was in [place] as well as [place]. He did have an episode of dizziness.  Current Outpatient Prescriptions  Medication Sig Dispense Refill  . amLODipine (NORVASC) 5 MG tablet Take 5 mg by mouth daily.    Marland Kitchen aspirin 81 MG tablet Take 81 mg by mouth daily.    Marland Kitchen atorvastatin (LIPITOR) 20 MG tablet Take 20 mg by mouth daily.    . Calcium Carb-Cholecalciferol (CALCIUM 1000 + D PO) Take by mouth daily.    . Coenzyme Q10 (COQ10 PO) Take by mouth.    Marland Kitchen  glucosamine-chondroitin 500-400 MG tablet Take 1 tablet by mouth 3 (three) times daily.    Marland Kitchen losartan (COZAAR) 100 MG tablet Take 100 mg by mouth daily.    . metoprolol (LOPRESSOR) 50 MG tablet Take 0.5 tablets by mouth 2 (two) times daily.  1  . Multiple Vitamin (MULTIVITAMIN) tablet Take 1 tablet by mouth daily.    . Omega-3 Fatty Acids (FISH OIL) 1200 MG CAPS Take by mouth daily.     No current facility-administered medications for this visit.    Allergies  Allergen Reactions  . Plavix [Clopidogrel Bisulfate]     Unknown reaction    History   Social History  . Marital Status: Married    Spouse Name: N/A    Number of Children: N/A  . Years of Education: N/A   Occupational History  . Not on file.   Social History Main Topics  . Smoking status: Never Smoker   . Smokeless tobacco: Never Used  . Alcohol Use: No  . Drug Use: No  . Sexual Activity: Not on file   Other Topics Concern  . Not on file   Social History Narrative     Review of Systems: General: negative for chills, fever, night sweats or weight changes.  Cardiovascular: negative for chest pain, dyspnea on exertion, edema, orthopnea, palpitations, paroxysmal nocturnal dyspnea or shortness of breath Dermatological: negative for rash Respiratory: negative for cough or wheezing Urologic: negative for hematuria Abdominal: negative for nausea, vomiting, diarrhea, bright red blood  per rectum, melena, or hematemesis Neurologic: negative for visual changes, syncope, or dizziness All other systems reviewed and are otherwise negative except as noted above.    Blood pressure 130/92, pulse 57, height 5\' 11"  (1.803 m), weight 170 lb 8 oz (77.338 kg).  General appearance: alert and no distress Neck: no adenopathy, no carotid bruit, no JVD, supple, symmetrical, trachea midline and thyroid not enlarged, symmetric, no tenderness/mass/nodules Lungs: clear to auscultation bilaterally Heart: regular rate and rhythm, S1, S2  normal, no murmur, click, rub or gallop Extremities: extremities normal, atraumatic, no cyanosis or edema  EKG sinus bradycardia at 57 without ST or T-wave changes  ASSESSMENT AND PLAN:   CAD - LAD PCI '97, LAD/CFX DES 7/12 History of CAD status post LAD stenting by Dr. Claiborne Billings 04/07/96 with a [place] PS-1530 stent. Because of a uncomfortable sensation in his chest a Myoview stress test performed 01/16/11 showed mild to moderate septal ischemia. I catheterized him 01/29/11 revealing high-grade proximal LAD and AV groove circumflex disease both of which I stented with resolute drug-eluting stents. His previously placed LAD stent was widely patent. He has remained asymptomatic since.  HTN (hypertension) Controlled on current medications  Dyslipidemia On statin therapy with recent lipid profile performed by his PCP 03/16/14 revealing a total cholesterol 114, LDL of 58 and HDL of 45      Lorretta Harp MD [facility], [facility] 05/15/2014 8:34 AM

## 2014-05-15 NOTE — Assessment & Plan Note (Signed)
Controlled on current medications 

## 2014-09-19 DIAGNOSIS — Z23 Encounter for immunization: Secondary | ICD-10-CM | POA: Diagnosis not present

## 2014-09-19 DIAGNOSIS — R7301 Impaired fasting glucose: Secondary | ICD-10-CM | POA: Diagnosis not present

## 2014-09-19 DIAGNOSIS — E785 Hyperlipidemia, unspecified: Secondary | ICD-10-CM | POA: Diagnosis not present

## 2014-09-19 DIAGNOSIS — Z1389 Encounter for screening for other disorder: Secondary | ICD-10-CM | POA: Diagnosis not present

## 2014-09-19 DIAGNOSIS — M8580 Other specified disorders of bone density and structure, unspecified site: Secondary | ICD-10-CM | POA: Diagnosis not present

## 2014-09-19 DIAGNOSIS — Z125 Encounter for screening for malignant neoplasm of prostate: Secondary | ICD-10-CM | POA: Diagnosis not present

## 2014-09-19 DIAGNOSIS — I10 Essential (primary) hypertension: Secondary | ICD-10-CM | POA: Diagnosis not present

## 2014-09-19 DIAGNOSIS — Z0000 Encounter for general adult medical examination without abnormal findings: Secondary | ICD-10-CM | POA: Diagnosis not present

## 2014-09-26 DIAGNOSIS — I10 Essential (primary) hypertension: Secondary | ICD-10-CM | POA: Diagnosis not present

## 2014-09-26 DIAGNOSIS — E785 Hyperlipidemia, unspecified: Secondary | ICD-10-CM | POA: Diagnosis not present

## 2014-09-26 DIAGNOSIS — R739 Hyperglycemia, unspecified: Secondary | ICD-10-CM | POA: Diagnosis not present

## 2014-09-26 DIAGNOSIS — I2510 Atherosclerotic heart disease of native coronary artery without angina pectoris: Secondary | ICD-10-CM | POA: Diagnosis not present

## 2014-11-26 ENCOUNTER — Encounter: Payer: Self-pay | Admitting: Cardiology

## 2014-11-26 ENCOUNTER — Ambulatory Visit (INDEPENDENT_AMBULATORY_CARE_PROVIDER_SITE_OTHER): Payer: Medicare Other | Admitting: Cardiology

## 2014-11-26 VITALS — BP 126/78 | HR 60 | Ht 70.0 in | Wt 171.7 lb

## 2014-11-26 DIAGNOSIS — I10 Essential (primary) hypertension: Secondary | ICD-10-CM | POA: Diagnosis not present

## 2014-11-26 DIAGNOSIS — I739 Peripheral vascular disease, unspecified: Secondary | ICD-10-CM | POA: Diagnosis not present

## 2014-11-26 DIAGNOSIS — E785 Hyperlipidemia, unspecified: Secondary | ICD-10-CM | POA: Diagnosis not present

## 2014-11-26 DIAGNOSIS — I2510 Atherosclerotic heart disease of native coronary artery without angina pectoris: Secondary | ICD-10-CM

## 2014-11-26 NOTE — Progress Notes (Signed)
Cardiology Office Note   Date:  11/26/2014   ID:  Evan [name], DOB [birth year], MRN [id number]  PCP:  Thressa Sheller, MD  Cardiologist:  Dr. Gwenlyn Found    Chief Complaint  Patient presents with  . Coronary Artery Disease    6 months:  No complaints of chest pain, SOB, edema or dizziness.      History of Present Illness: Evan [name] is a 68 y.o. male who presents for CAD.    He has a history of CAD status post LAD stenting by Dr. Ellouise Newer April 07, 1996, with a J&J PS-1530 stent. 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 Kennith Gain at [facility] in 2010. He did have a follow up evaluation there 2 yrs later and was released from their service.. Because of complaints of an uncomfortable pressure sensation in his chest, a Myoview stress test performed January 16, 2011, showed mild to moderate septal ischemia. He was catheterized January 29, 2011, revealing high-grade proximal LAD as well as AV groove circumflex disease, both of which were stented with Resolute drug-eluting stents. His previously placed LAD stent was widely patent. He has been asymptomatic since. His other problems include hypertension and hyperlipidemia. Dr. Noah Delaine follows his lipid profile. His most recent lipid profile performed by his PCP 03/16/14 revealed a total cholesterol of 114, LDL of 58 and HDL 45.  Today he has no complaints, no chest pain, no SOB. Stable EKG.  He travels to [place] for several months at a time.  He eats more healthy in [place] than here. He exercises every day.  Past Medical History  Diagnosis Date  . Hypertension   . Vertigo   . Aneurysm artery, renal 2010    Surgery 2010 at [facility]  . Coronary artery disease sept 1997    LAD PCI '97, LAD/CFX DES 7/12  . Hyperlipidemia     PCP follows  . OSA on CPAP 07/2008    Past Surgical History  Procedure Laterality Date  . Renal arterial aneursym  05/21/09    surgerical resectd by Dr  Kennith Gain at [facility]  . Coronary stent placement  04/06/96 & 01/29/11    LAD PCI '97, LAD/CFX DES 7/12  . Colonoscopy    . Nm myocar perf wall motion  03/09/2008    subtle septal ischemia which decide to treat mediclly  . Nm myocar perf wall motion  01/16/2011    showed mild to moderate septal ischemia  . Doppler echocardiography  03/09/2008    EF 50-55%     Current Outpatient Prescriptions  Medication Sig Dispense Refill  . amLODipine (NORVASC) 5 MG tablet Take 5 mg by mouth daily.    Marland Kitchen aspirin 81 MG tablet Take 81 mg by mouth daily.    Marland Kitchen atorvastatin (LIPITOR) 20 MG tablet Take 20 mg by mouth daily.    . Calcium Carb-Cholecalciferol (CALCIUM 1000 + D PO) Take by mouth daily.    . Coenzyme Q10 (COQ10 PO) Take by mouth.    Marland Kitchen glucosamine-chondroitin 500-400 MG tablet Take 1 tablet by mouth 3 (three) times daily.    Marland Kitchen losartan (COZAAR) 100 MG tablet Take 100 mg by mouth daily.    . metoprolol (LOPRESSOR) 50 MG tablet Take 0.5 tablets by mouth 2 (two) times daily.  1  . Multiple Vitamin (MULTIVITAMIN) tablet Take 1 tablet by mouth daily.    . Omega-3 Fatty Acids (FISH OIL) 1200 MG CAPS Take by  mouth daily.     No current facility-administered medications for this visit.    Allergies:   Plavix    Social History:  The patient  reports that he has never smoked. He has never used smokeless tobacco. He reports that he does not drink alcohol or use illicit drugs.   Family History:  The patient's family history includes Hypertension in his mother.    ROS:  General:no colds or fevers, no weight changes Skin:no rashes or ulcers HEENT:no blurred vision, no congestion CV:see HPI PUL:see HPI GI:no diarrhea constipation or melena, no indigestion GU:no hematuria, no dysuria MS:no joint pain, no claudication Neuro:no syncope, no lightheadedness Endo:no diabetes, no thyroid disease  Wt Readings from Last 3 Encounters:  11/26/14 171 lb 11.2 oz (77.883 kg)  05/15/14 170 lb 8 oz  (77.338 kg)  09/26/13 174 lb (78.926 kg)     PHYSICAL EXAM: VS:  BP 126/78 mmHg  Pulse 60  Ht 5\' 10"  (1.778 m)  Wt 171 lb 11.2 oz (77.883 kg)  BMI 24.64 kg/m2 , BMI Body mass index is 24.64 kg/(m^2). General:Pleasant affect, NAD Skin:Warm and dry, brisk capillary refill HEENT:normocephalic, sclera clear, mucus membranes moist Neck:supple, no JVD, no bruits  Heart:S1S2 RRR without murmur, gallup, rub or click Lungs:clear without rales, rhonchi, or wheezes DEY:CXKG, non tender, + BS, do not palpate liver spleen or masses Ext:no lower ext edema, 2+ pedal pulses, 2+ radial pulses Neuro:alert and oriented X 3, MAE, follows commands, + facial symmetry    EKG:  EKG is ordered today. The ekg ordered today demonstrates SR with early repol and no acute changes from previous.   Recent Labs: 12/07/2013: ALT 26    Lipid Panel No results found for: CHOL, TRIG, HDL, CHOLHDL, VLDL, LDLCALC, LDLDIRECT  see above for lipid panel, done through PCP.   Other studies Reviewed: Additional studies/ records that were reviewed today include:previous notes..   ASSESSMENT AND PLAN:  CAD - LAD PCI '97, LAD/CFX DES 7/12 History of CAD status post LAD stenting by Dr. Claiborne Billings 04/07/96 with a [place] PS-1530 stent. Because of a uncomfortable sensation in his chest a Myoview stress test performed 01/16/11 showed mild to moderate septal ischemia. Dr. Adora Fridge  catheterized him 01/29/11 revealing high-grade proximal LAD and AV groove circumflex disease both of which were stented with resolute drug-eluting stents. His previously placed LAD stent was widely patent. He is asymptomatic.  He will follow up with Dr. Adora Fridge in 6 months.  HTN (hypertension) Controlled on current medications  Dyslipidemia On statin therapy with recent lipid profile performed by his PCP 03/16/14 revealing a total cholesterol 114, LDL of 58 and HDL of 45     Current medicines are reviewed with the patient today.  The  patient Has no concerns regarding medicines.  The following changes have been made:  See above Labs/ tests ordered today include:see above  Disposition:   FU:  see above  Lennie Muckle, NP  11/26/2014 5:05 PM    [facility] [place], [place], [place], [place] Phone: [phone]; Fax: [phone]

## 2014-11-26 NOTE — Patient Instructions (Signed)
Your physician recommends that you schedule a follow-up appointment in:6 months with Dr Berry 

## 2015-01-07 ENCOUNTER — Other Ambulatory Visit: Payer: Self-pay

## 2015-03-20 DIAGNOSIS — R739 Hyperglycemia, unspecified: Secondary | ICD-10-CM | POA: Diagnosis not present

## 2015-03-20 DIAGNOSIS — I10 Essential (primary) hypertension: Secondary | ICD-10-CM | POA: Diagnosis not present

## 2015-03-20 DIAGNOSIS — E785 Hyperlipidemia, unspecified: Secondary | ICD-10-CM | POA: Diagnosis not present

## 2015-03-27 DIAGNOSIS — Z23 Encounter for immunization: Secondary | ICD-10-CM | POA: Diagnosis not present

## 2015-03-27 DIAGNOSIS — N182 Chronic kidney disease, stage 2 (mild): Secondary | ICD-10-CM | POA: Diagnosis not present

## 2015-03-27 DIAGNOSIS — I129 Hypertensive chronic kidney disease with stage 1 through stage 4 chronic kidney disease, or unspecified chronic kidney disease: Secondary | ICD-10-CM | POA: Diagnosis not present

## 2015-03-27 DIAGNOSIS — I2510 Atherosclerotic heart disease of native coronary artery without angina pectoris: Secondary | ICD-10-CM | POA: Diagnosis not present

## 2015-03-27 DIAGNOSIS — R7309 Other abnormal glucose: Secondary | ICD-10-CM | POA: Diagnosis not present

## 2015-06-14 ENCOUNTER — Encounter: Payer: Self-pay | Admitting: Cardiovascular Disease

## 2015-06-14 ENCOUNTER — Ambulatory Visit (INDEPENDENT_AMBULATORY_CARE_PROVIDER_SITE_OTHER): Payer: Medicare Other | Admitting: Cardiovascular Disease

## 2015-06-14 VITALS — BP 154/82 | HR 46 | Ht 70.0 in | Wt 172.0 lb

## 2015-06-14 DIAGNOSIS — E785 Hyperlipidemia, unspecified: Secondary | ICD-10-CM | POA: Diagnosis not present

## 2015-06-14 DIAGNOSIS — I2510 Atherosclerotic heart disease of native coronary artery without angina pectoris: Secondary | ICD-10-CM | POA: Diagnosis not present

## 2015-06-14 DIAGNOSIS — I10 Essential (primary) hypertension: Secondary | ICD-10-CM | POA: Diagnosis not present

## 2015-06-14 DIAGNOSIS — I2583 Coronary atherosclerosis due to lipid rich plaque: Secondary | ICD-10-CM

## 2015-06-14 NOTE — Progress Notes (Signed)
06/14/2015 Evan [name]   05/08/1946  [id number]  Primary Physician Thressa Sheller, MD Primary Cardiologist: Lorretta Harp MD Renae Gloss   HPI:  The patient is a very pleasant 69 year old, thin-appearing, married Asian male, father of 2, grandfather to 1 grandchild who is retired from [employer]. I last saw him 05/15/14. His wife is a Software engineer at [employer]. He has a history of CAD status post LAD stenting by Dr. Ellouise Newer April 07, 1996, with a J&J PS-1530 stent. 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 Kennith Gain at [facility] in 2010. He did have a follow up evaluation there 2 yrs later and was released from their service.. Because of complaints of an uncomfortable pressure sensation in his chest, a Myoview stress test performed January 16, 2011, showed mild to moderate septal ischemia. He was catheterized January 29, 2011, revealing high-grade proximal LAD as well as AV groove circumflex disease, both of which were stented with Resolute drug-eluting stents. His previously placed LAD stent was widely patent. He has been asymptomatic since. His other problems include hypertension and hyperlipidemia. Dr. Noah Delaine follows his lipid profile. His most recent lipid profile performed by his PCP 03/16/14 revealed a total cholesterol of 114, LDL of 58 and HDL 45 since I saw him last he denies chest pain or shortness of breath. He does travel quite a bit to [place] as well as [place].    Current Outpatient Prescriptions  Medication Sig Dispense Refill  . amLODipine (NORVASC) 5 MG tablet Take 5 mg by mouth daily.    Marland Kitchen aspirin 81 MG tablet Take 81 mg by mouth daily.    Marland Kitchen atorvastatin (LIPITOR) 20 MG tablet Take 20 mg by mouth daily.    . Calcium Carb-Cholecalciferol (CALCIUM 1000 + D PO) Take by mouth daily.    . Coenzyme Q10 (COQ10 PO) Take by mouth.    Marland Kitchen glucosamine-chondroitin 500-400 MG tablet Take 1 tablet by  mouth 3 (three) times daily.    Marland Kitchen losartan (COZAAR) 100 MG tablet Take 100 mg by mouth daily.    . metoprolol (LOPRESSOR) 50 MG tablet Take 0.5 tablets by mouth 2 (two) times daily.  1  . Multiple Vitamin (MULTIVITAMIN) tablet Take 1 tablet by mouth daily.    . Omega-3 Fatty Acids (FISH OIL) 1200 MG CAPS Take by mouth daily.     No current facility-administered medications for this visit.    Allergies  Allergen Reactions  . Plavix [Clopidogrel Bisulfate]     Unknown reaction    Social History   Social History  . Marital Status: Married    Spouse Name: N/A  . Number of Children: N/A  . Years of Education: N/A   Occupational History  . Not on file.   Social History Main Topics  . Smoking status: Never Smoker   . Smokeless tobacco: Never Used  . Alcohol Use: No  . Drug Use: No  . Sexual Activity: Not on file   Other Topics Concern  . Not on file   Social History Narrative     Review of Systems: General: negative for chills, fever, night sweats or weight changes.  Cardiovascular: negative for chest pain, dyspnea on exertion, edema, orthopnea, palpitations, paroxysmal nocturnal dyspnea or shortness of breath Dermatological: negative for rash Respiratory: negative for cough or wheezing Urologic: negative for hematuria Abdominal: negative for nausea, vomiting, diarrhea, bright red blood per rectum, melena, or hematemesis Neurologic: negative  for visual changes, syncope, or dizziness All other systems reviewed and are otherwise negative except as noted above.    Blood pressure 154/82, pulse 46, height 5\' 10"  (1.778 m), weight 172 lb (78.019 kg).  General appearance: alert and no distress Neck: no adenopathy, no carotid bruit, no JVD, supple, symmetrical, trachea midline and thyroid not enlarged, symmetric, no tenderness/mass/nodules Lungs: clear to auscultation bilaterally Heart: regular rate and rhythm, S1, S2 normal, no murmur, click, rub or gallop Extremities:  extremities normal, atraumatic, no cyanosis or edema  EKG sinus bradycardia 46 without ST or T-wave changes. I personally reviewed this EKG  ASSESSMENT AND PLAN:   HTN (hypertension) History of hypertension blood pressure measured at 154/82. He is on amlodipine, losartan and metoprolol. Continue current meds at current dosing  Dyslipidemia History of hyperlipidemia on atorvastatin with recent blood work performed by his PCP 03/20/15 revealing a total cholesterol 127, LDL 66 and HDL of 46.  CAD - LAD PCI '97, LAD/CFX DES 7/12 History of CAD status post LAD stenting by Dr. Ellouise Newer 04/07/96 with a J&J PS-1530 stent. Because of complaints of chest pressure a Myoview stress test performed 01/16/11 showed mild to moderate septal ischemia. He was recatheterized 01/29/11 revealing a high-grade proximal LAD stenosis as well as AV groove circumflex disease both of which were stented using resolute drug-eluting stents. His previous replaced LAD stent was widely patent. He remains asymptomatic at this time.      Lorretta Harp MD FACP,FACC,FAHA, [facility] 06/14/2015 9:28 AM

## 2015-06-14 NOTE — Patient Instructions (Signed)

## 2015-06-14 NOTE — Assessment & Plan Note (Signed)
History of hyperlipidemia on atorvastatin with recent blood work performed by his PCP 03/20/15 revealing a total cholesterol 127, LDL 66 and HDL of 46.

## 2015-06-14 NOTE — Assessment & Plan Note (Signed)
History of hypertension blood pressure measured at 154/82. He is on amlodipine, losartan and metoprolol. Continue current meds at current dosing

## 2015-06-14 NOTE — Assessment & Plan Note (Signed)
History of CAD status post LAD stenting by Dr. Ellouise Newer 04/07/96 with a J&J PS-1530 stent. Because of complaints of chest pressure a Myoview stress test performed 01/16/11 showed mild to moderate septal ischemia. He was recatheterized 01/29/11 revealing a high-grade proximal LAD stenosis as well as AV groove circumflex disease both of which were stented using resolute drug-eluting stents. His previous replaced LAD stent was widely patent. He remains asymptomatic at this time.

## 2015-08-27 ENCOUNTER — Encounter: Payer: Self-pay | Admitting: Internal Medicine

## 2015-08-30 ENCOUNTER — Encounter: Payer: Self-pay | Admitting: Internal Medicine

## 2015-09-24 DIAGNOSIS — I10 Essential (primary) hypertension: Secondary | ICD-10-CM | POA: Diagnosis not present

## 2015-09-24 DIAGNOSIS — Z1389 Encounter for screening for other disorder: Secondary | ICD-10-CM | POA: Diagnosis not present

## 2015-09-24 DIAGNOSIS — Z0000 Encounter for general adult medical examination without abnormal findings: Secondary | ICD-10-CM | POA: Diagnosis not present

## 2015-09-24 DIAGNOSIS — Z125 Encounter for screening for malignant neoplasm of prostate: Secondary | ICD-10-CM | POA: Diagnosis not present

## 2015-09-24 DIAGNOSIS — E785 Hyperlipidemia, unspecified: Secondary | ICD-10-CM | POA: Diagnosis not present

## 2015-09-24 DIAGNOSIS — R7309 Other abnormal glucose: Secondary | ICD-10-CM | POA: Diagnosis not present

## 2015-09-24 DIAGNOSIS — N390 Urinary tract infection, site not specified: Secondary | ICD-10-CM | POA: Diagnosis not present

## 2015-09-24 DIAGNOSIS — I129 Hypertensive chronic kidney disease with stage 1 through stage 4 chronic kidney disease, or unspecified chronic kidney disease: Secondary | ICD-10-CM | POA: Diagnosis not present

## 2015-10-01 DIAGNOSIS — I209 Angina pectoris, unspecified: Secondary | ICD-10-CM | POA: Diagnosis not present

## 2015-10-01 DIAGNOSIS — R7309 Other abnormal glucose: Secondary | ICD-10-CM | POA: Diagnosis not present

## 2015-10-01 DIAGNOSIS — N182 Chronic kidney disease, stage 2 (mild): Secondary | ICD-10-CM | POA: Diagnosis not present

## 2015-10-01 DIAGNOSIS — I129 Hypertensive chronic kidney disease with stage 1 through stage 4 chronic kidney disease, or unspecified chronic kidney disease: Secondary | ICD-10-CM | POA: Diagnosis not present

## 2015-10-15 ENCOUNTER — Ambulatory Visit (AMBULATORY_SURGERY_CENTER): Payer: Self-pay | Admitting: *Deleted

## 2015-10-15 VITALS — Ht 70.0 in | Wt 175.0 lb

## 2015-10-15 DIAGNOSIS — Z86010 Personal history of colonic polyps: Secondary | ICD-10-CM

## 2015-10-15 MED ORDER — SUPREP BOWEL PREP KIT 17.5-3.13-1.6 GM/177ML PO SOLN: 1.0000 | Freq: Once | ORAL | Status: DC

## 2015-10-15 NOTE — Progress Notes (Signed)
Patient denies any allergies to egg or soy products. Patient denies complications with anesthesia/sedation.  Patient denies oxygen use at home and denies diet medications. Patient has sleep apnea but does not have CPAP.  Emmi instructions for colonoscopy explained but patient denied.

## 2015-10-29 ENCOUNTER — Encounter: Payer: Self-pay | Admitting: Internal Medicine

## 2015-10-29 ENCOUNTER — Ambulatory Visit (AMBULATORY_SURGERY_CENTER): Payer: Medicare Other | Admitting: Internal Medicine

## 2015-10-29 VITALS — BP 124/71 | HR 52 | Temp 98.6°F | Resp 15 | Ht 70.0 in | Wt 172.0 lb

## 2015-10-29 DIAGNOSIS — D124 Benign neoplasm of descending colon: Secondary | ICD-10-CM

## 2015-10-29 DIAGNOSIS — I739 Peripheral vascular disease, unspecified: Secondary | ICD-10-CM | POA: Diagnosis not present

## 2015-10-29 DIAGNOSIS — Z86010 Personal history of colonic polyps: Secondary | ICD-10-CM

## 2015-10-29 DIAGNOSIS — G4733 Obstructive sleep apnea (adult) (pediatric): Secondary | ICD-10-CM | POA: Diagnosis not present

## 2015-10-29 DIAGNOSIS — D125 Benign neoplasm of sigmoid colon: Secondary | ICD-10-CM

## 2015-10-29 DIAGNOSIS — Z1211 Encounter for screening for malignant neoplasm of colon: Secondary | ICD-10-CM | POA: Diagnosis not present

## 2015-10-29 DIAGNOSIS — I2510 Atherosclerotic heart disease of native coronary artery without angina pectoris: Secondary | ICD-10-CM | POA: Diagnosis not present

## 2015-10-29 MED ORDER — SODIUM CHLORIDE 0.9 % IV SOLN: 500.0000 mL | INTRAVENOUS | Status: DC

## 2015-10-29 NOTE — Progress Notes (Signed)
To recovery, report to [place], RN, VSS 

## 2015-10-29 NOTE — Op Note (Signed)
[facility] Patient Name: Evan [name] Procedure Date: 10/29/2015 9:45 AM MRN: [id number] Endoscopist: Jerene Bears , MD Age: 70 Date of Birth: Apr 10, 1946 Gender: Male Procedure:                Colonoscopy Indications:              Surveillance: Personal history of adenomatous                            polyps on last colonoscopy 3 years ago (Dr.                            Sharlett Iles) Medicines:                Monitored Anesthesia Care Procedure:                Pre-Anesthesia Assessment:                           - Prior to the procedure, a History and Physical                            was performed, and patient medications and                            allergies were reviewed. The patient's tolerance of                            previous anesthesia was also reviewed. The risks                            and benefits of the procedure and the sedation                            options and risks were discussed with the patient.                            All questions were answered, and informed consent                            was obtained. Prior Anticoagulants: The patient has                            taken no previous anticoagulant or antiplatelet                            agents. ASA Grade Assessment: III - A patient with                            severe systemic disease. After reviewing the risks                            and benefits, the patient was deemed in  satisfactory condition to undergo the procedure.                           After obtaining informed consent, the colonoscope                            was passed under direct vision. Throughout the                            procedure, the patient's blood pressure, pulse, and                            oxygen saturations were monitored continuously. The                            Model CF-HQ190L [id number]) scope was introduced                            through the anus and  advanced to the the cecum,                            identified by appendiceal orifice and ileocecal                            valve. The colonoscopy was performed without                            difficulty. The patient tolerated the procedure                            well. The quality of the bowel preparation was                            good. The ileocecal valve, appendiceal orifice, and                            rectum were photographed. Scope In: 9:53:24 AM Scope Out: 10:11:32 AM Scope Withdrawal Time: 0 hours 13 minutes 57 seconds  Total Procedure Duration: 0 hours 18 minutes 8 seconds  Findings:                 The digital rectal exam was normal.                           Three sessile polyps were found in the sigmoid                            colon (2) and descending colon (1). The polyps were                            3 to 6 mm in size. These polyps were removed with a                            cold snare. Resection and retrieval were  complete.                           Internal hemorrhoids were found during                            retroflexion. The hemorrhoids were medium-sized. Complications:            No immediate complications. Estimated Blood Loss:     Estimated blood loss: none. Impression:               - Three 3 to 6 mm polyps in the sigmoid colon and                            in the descending colon, removed with a cold snare.                            Resected and retrieved.                           - Internal hemorrhoids. Recommendation:           - Patient has a contact number available for                            emergencies. The signs and symptoms of potential                            delayed complications were discussed with the                            patient. Return to normal activities tomorrow.                            Written discharge instructions were provided to the                            patient.                            - Resume previous diet.                           - Continue present medications.                           - Await pathology results.                           - Repeat colonoscopy is recommended for                            surveillance. The colonoscopy date will be                            determined after pathology results from today's  exam become available for review. Jerene Bears, MD 10/29/2015 10:14:56 AM This report has been signed electronically.

## 2015-10-29 NOTE — Progress Notes (Signed)
Called to room to assist during endoscopic procedure.  Patient ID and intended procedure confirmed with present staff. Received instructions for my participation in the procedure from the performing physician.  

## 2015-10-29 NOTE — Patient Instructions (Signed)
YOU HAD AN ENDOSCOPIC PROCEDURE TODAY AT [facility]:   Refer to the procedure report that was given to you for any specific questions about what was found during the examination.  If the procedure report does not answer your questions, please call your gastroenterologist to clarify.  If you requested that your care partner not be given the details of your procedure findings, then the procedure report has been included in a sealed envelope for you to review at your convenience later.  YOU SHOULD EXPECT: Some feelings of bloating in the abdomen. Passage of more gas than usual.  Walking can help get rid of the air that was put into your GI tract during the procedure and reduce the bloating. If you had a lower endoscopy (such as a colonoscopy or flexible sigmoidoscopy) you may notice spotting of blood in your stool or on the toilet paper. If you underwent a bowel prep for your procedure, you may not have a normal bowel movement for a few days.  Please Note:  You might notice some irritation and congestion in your nose or some drainage.  This is from the oxygen used during your procedure.  There is no need for concern and it should clear up in a day or so.  SYMPTOMS TO REPORT IMMEDIATELY:   Following lower endoscopy (colonoscopy or flexible sigmoidoscopy):  Excessive amounts of blood in the stool  Significant tenderness or worsening of abdominal pains  Swelling of the abdomen that is new, acute  Fever of 100F or higher    For urgent or emergent issues, a gastroenterologist can be reached at any hour by calling [phone].   DIET: Your first meal following the procedure should be a small meal and then it is ok to progress to your normal diet. Heavy or fried foods are harder to digest and may make you feel nauseous or bloated.  Likewise, meals heavy in dairy and vegetables can increase bloating.  Drink plenty of fluids but you should avoid alcoholic beverages for 24  hours.  ACTIVITY:  You should plan to take it easy for the rest of today and you should NOT DRIVE or use heavy machinery until tomorrow (because of the sedation medicines used during the test).    FOLLOW UP: Our staff will call the number listed on your records the next business day following your procedure to check on you and address any questions or concerns that you may have regarding the information given to you following your procedure. If we do not reach you, we will leave a message.  However, if you are feeling well and you are not experiencing any problems, there is no need to return our call.  We will assume that you have returned to your regular daily activities without incident.  If any biopsies were taken you will be contacted by phone or by letter within the next 1-3 weeks.  Please call us at [phone] if you have not heard about the biopsies in 3 weeks.    SIGNATURES/CONFIDENTIALITY: You and/or your care partner have signed paperwork which will be entered into your electronic medical record.  These signatures attest to the fact that that the information above on your After Visit Summary has been reviewed and is understood.  Full responsibility of the confidentiality of this discharge information lies with you and/or your care-partner.   INFORMATION ON POLYPS AND HEMORRHOIDS GIVEN TO YOU TODAY

## 2015-10-30 ENCOUNTER — Telehealth: Payer: Self-pay | Admitting: *Deleted

## 2015-10-30 NOTE — Telephone Encounter (Signed)
  Follow up Call-  Call back number 10/29/2015  Post procedure Call Back phone  # [phone]  Permission to leave phone message Yes     Patient questions:  Do you have a fever, pain , or abdominal swelling? No. Pain Score  0 *  Have you tolerated food without any problems? Yes.    Have you been able to return to your normal activities? Yes.    Do you have any questions about your discharge instructions: Diet   No. Medications  No. Follow up visit  No.  Do you have questions or concerns about your Care? No.  Actions: * If pain score is 4 or above: No action needed, pain <4.

## 2015-11-04 ENCOUNTER — Encounter: Payer: Self-pay | Admitting: Internal Medicine

## 2016-03-11 ENCOUNTER — Other Ambulatory Visit: Payer: Self-pay

## 2016-04-09 DIAGNOSIS — I129 Hypertensive chronic kidney disease with stage 1 through stage 4 chronic kidney disease, or unspecified chronic kidney disease: Secondary | ICD-10-CM | POA: Diagnosis not present

## 2016-04-09 DIAGNOSIS — E785 Hyperlipidemia, unspecified: Secondary | ICD-10-CM | POA: Diagnosis not present

## 2016-04-09 DIAGNOSIS — R7309 Other abnormal glucose: Secondary | ICD-10-CM | POA: Diagnosis not present

## 2016-04-09 DIAGNOSIS — I10 Essential (primary) hypertension: Secondary | ICD-10-CM | POA: Diagnosis not present

## 2016-04-16 DIAGNOSIS — I129 Hypertensive chronic kidney disease with stage 1 through stage 4 chronic kidney disease, or unspecified chronic kidney disease: Secondary | ICD-10-CM | POA: Diagnosis not present

## 2016-04-16 DIAGNOSIS — I209 Angina pectoris, unspecified: Secondary | ICD-10-CM | POA: Diagnosis not present

## 2016-04-16 DIAGNOSIS — Z23 Encounter for immunization: Secondary | ICD-10-CM | POA: Diagnosis not present

## 2016-04-16 DIAGNOSIS — R7309 Other abnormal glucose: Secondary | ICD-10-CM | POA: Diagnosis not present

## 2016-04-16 DIAGNOSIS — N182 Chronic kidney disease, stage 2 (mild): Secondary | ICD-10-CM | POA: Diagnosis not present

## 2016-06-10 ENCOUNTER — Encounter: Payer: Self-pay | Admitting: Cardiovascular Disease

## 2016-06-10 ENCOUNTER — Ambulatory Visit (INDEPENDENT_AMBULATORY_CARE_PROVIDER_SITE_OTHER): Payer: Medicare Other | Admitting: Cardiovascular Disease

## 2016-06-10 VITALS — BP 146/84 | HR 50 | Ht 70.0 in | Wt 176.0 lb

## 2016-06-10 DIAGNOSIS — I2510 Atherosclerotic heart disease of native coronary artery without angina pectoris: Secondary | ICD-10-CM | POA: Diagnosis not present

## 2016-06-10 DIAGNOSIS — I10 Essential (primary) hypertension: Secondary | ICD-10-CM

## 2016-06-10 DIAGNOSIS — E785 Hyperlipidemia, unspecified: Secondary | ICD-10-CM | POA: Diagnosis not present

## 2016-06-10 DIAGNOSIS — I739 Peripheral vascular disease, unspecified: Secondary | ICD-10-CM | POA: Diagnosis not present

## 2016-06-10 NOTE — Assessment & Plan Note (Signed)
History of hyperlipidemia on statin therapy with recent lipid profile performed by his PCP 04/01/16 revealed a total cholesterol 139, LDL 68 and HDL of 60

## 2016-06-10 NOTE — Patient Instructions (Signed)

## 2016-06-10 NOTE — Progress Notes (Signed)
06/10/2016 Evan [name]   03/13/1946  [id number]  Primary Physician Thressa Sheller, MD Primary Cardiologist: Lorretta Harp MD Renae Gloss  HPI:  The patient is a very pleasant 70 year old, thin-appearing, married Asian male, father of 2, grandfather to 1 grandchild who is retired from [employer]. I last saw him 06/14/15. His wife is a Software engineer at [employer]. He has a history of CAD status post LAD stenting by Dr. Ellouise Newer April 07, 1996, with a J&J PS-1530 stent. 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 Kennith Gain at [facility] in 2010. He did have a follow up evaluation there 2 yrs later and was released from their service.. Because of complaints of an uncomfortable pressure sensation in his chest, a Myoview stress test performed January 16, 2011, showed mild to moderate septal ischemia. He was catheterized January 29, 2011, revealing high-grade proximal LAD as well as AV groove circumflex disease, both of which were stented with Resolute drug-eluting stents. His previously placed LAD stent was widely patent. He has been asymptomatic since. His other problems include hypertension and hyperlipidemia. Dr. Noah Delaine follows his lipid profile. His most recent lipid profile performed by his PCP 04/09/16 revealed a total cholesterol of 139, LDL of 68 and HDL of 60. Since I saw him last he denies chest pain or shortness of breath. He does travel quite a bit to [place] as well as [place] where his son and grandchildren live.   Current Outpatient Prescriptions  Medication Sig Dispense Refill  . amLODipine (NORVASC) 5 MG tablet Take 5 mg by mouth daily.    Marland Kitchen aspirin 81 MG tablet Take 81 mg by mouth daily.    Marland Kitchen atorvastatin (LIPITOR) 20 MG tablet Take 20 mg by mouth daily.    . Calcium Carb-Cholecalciferol (CALCIUM 1000 + D PO) Take by mouth daily.    . Coenzyme Q10 (COQ10 PO) Take by mouth.    Marland Kitchen  glucosamine-chondroitin 500-400 MG tablet Take 1 tablet by mouth 3 (three) times daily. Reported on 10/15/2015    . losartan (COZAAR) 100 MG tablet Take 100 mg by mouth daily.    . metoprolol (LOPRESSOR) 50 MG tablet Take 0.5 tablets by mouth 2 (two) times daily.  1  . Multiple Vitamin (MULTIVITAMIN) tablet Take 1 tablet by mouth daily.    . Omega-3 Fatty Acids (FISH OIL) 1200 MG CAPS Take by mouth daily.     No current facility-administered medications for this visit.     Allergies  Allergen Reactions  . Plavix [Clopidogrel Bisulfate]     Unknown reaction    Social History   Social History  . Marital status: Married    Spouse name: N/A  . Number of children: N/A  . Years of education: N/A   Occupational History  . Not on file.   Social History Main Topics  . Smoking status: Never Smoker  . Smokeless tobacco: Never Used  . Alcohol use 0.0 oz/week     Comment: socially wine  . Drug use: No  . Sexual activity: Not on file   Other Topics Concern  . Not on file   Social History Narrative  . No narrative on file     Review of Systems: General: negative for chills, fever, night sweats or weight changes.  Cardiovascular: negative for chest pain, dyspnea on exertion, edema, orthopnea, palpitations, paroxysmal nocturnal dyspnea or shortness of breath Dermatological: negative for rash Respiratory: negative  for cough or wheezing Urologic: negative for hematuria Abdominal: negative for nausea, vomiting, diarrhea, bright red blood per rectum, melena, or hematemesis Neurologic: negative for visual changes, syncope, or dizziness All other systems reviewed and are otherwise negative except as noted above.    Blood pressure (!) 146/84, pulse (!) 50, height 5\' 10"  (1.778 m), weight 176 lb (79.8 kg).  General appearance: alert and no distress Neck: no adenopathy, no carotid bruit, no JVD, supple, symmetrical, trachea midline and thyroid not enlarged, symmetric, no  tenderness/mass/nodules Lungs: clear to auscultation bilaterally Heart: regular rate and rhythm, S1, S2 normal, no murmur, click, rub or gallop Extremities: extremities normal, atraumatic, no cyanosis or edema  EKG sinus bradycardia at 50 with early repolarization pattern. I personally reviewed this EKG  ASSESSMENT AND PLAN:   CAD - LAD PCI '97, LAD/CFX DES 7/12 History of CAD status post stenting of his LAD by Dr. Ellouise Newer 04/07/96 with a P-S 1530 stent. He did have an abnormal stress test performed because of chest pressure 01/16/11 resulted in a cardiac catheterization 01/29/11 revealing high-grade proximal LAD and AV groove circumflex disease both of which were stented using resolute drug-eluting stents. His previously placed LAD stent was widely patent. He does exercise on a daily basis and denies chest pain or shortness of breath.  PVD renal artery aneurysm bypass [facility]) 2011 History of peripheral arterial disease status post renal bypass for renal artery aneurysm 2011 at [facility]  HTN (hypertension) History of hypertension blood pressure measured at 146/84. He is on amlodipine, metoprolol and losartan. Continue current meds at current dose  Dyslipidemia History of hyperlipidemia on statin therapy with recent lipid profile performed by his PCP 04/01/16 revealed a total cholesterol 139, LDL 68 and HDL of Nixon MD [facility], [facility] 06/10/2016 8:41 AM

## 2016-06-10 NOTE — Assessment & Plan Note (Signed)
History of peripheral arterial disease status post renal bypass for renal artery aneurysm 2011 at [facility].

## 2016-06-10 NOTE — Assessment & Plan Note (Signed)
History of hypertension blood pressure measured at 146/84. He is on amlodipine, metoprolol and losartan. Continue current meds at current dose

## 2016-06-10 NOTE — Assessment & Plan Note (Signed)
History of CAD status post stenting of his LAD by Dr. Ellouise Newer 04/07/96 with a P-S 1530 stent. He did have an abnormal stress test performed because of chest pressure 01/16/11 resulted in a cardiac catheterization 01/29/11 revealing high-grade proximal LAD and AV groove circumflex disease both of which were stented using resolute drug-eluting stents. His previously placed LAD stent was widely patent. He does exercise on a daily basis and denies chest pain or shortness of breath.

## 2016-06-16 ENCOUNTER — Ambulatory Visit: Payer: Medicare Other | Admitting: Cardiovascular Disease

## 2016-06-23 ENCOUNTER — Ambulatory Visit: Payer: Medicare Other | Admitting: Cardiovascular Disease

## 2016-11-02 DIAGNOSIS — N390 Urinary tract infection, site not specified: Secondary | ICD-10-CM | POA: Diagnosis not present

## 2016-11-02 DIAGNOSIS — E785 Hyperlipidemia, unspecified: Secondary | ICD-10-CM | POA: Diagnosis not present

## 2016-11-02 DIAGNOSIS — I129 Hypertensive chronic kidney disease with stage 1 through stage 4 chronic kidney disease, or unspecified chronic kidney disease: Secondary | ICD-10-CM | POA: Diagnosis not present

## 2016-11-02 DIAGNOSIS — Z125 Encounter for screening for malignant neoplasm of prostate: Secondary | ICD-10-CM | POA: Diagnosis not present

## 2016-11-02 DIAGNOSIS — I10 Essential (primary) hypertension: Secondary | ICD-10-CM | POA: Diagnosis not present

## 2016-11-11 DIAGNOSIS — I2510 Atherosclerotic heart disease of native coronary artery without angina pectoris: Secondary | ICD-10-CM | POA: Diagnosis not present

## 2016-11-11 DIAGNOSIS — Z0000 Encounter for general adult medical examination without abnormal findings: Secondary | ICD-10-CM | POA: Diagnosis not present

## 2016-11-11 DIAGNOSIS — E7800 Pure hypercholesterolemia, unspecified: Secondary | ICD-10-CM | POA: Diagnosis not present

## 2016-11-11 DIAGNOSIS — R7303 Prediabetes: Secondary | ICD-10-CM | POA: Diagnosis not present

## 2016-11-11 DIAGNOSIS — I10 Essential (primary) hypertension: Secondary | ICD-10-CM | POA: Diagnosis not present

## 2017-04-06 DIAGNOSIS — R7303 Prediabetes: Secondary | ICD-10-CM | POA: Diagnosis not present

## 2017-04-06 DIAGNOSIS — E7800 Pure hypercholesterolemia, unspecified: Secondary | ICD-10-CM | POA: Diagnosis not present

## 2017-04-13 DIAGNOSIS — R7303 Prediabetes: Secondary | ICD-10-CM | POA: Diagnosis not present

## 2017-04-13 DIAGNOSIS — I2510 Atherosclerotic heart disease of native coronary artery without angina pectoris: Secondary | ICD-10-CM | POA: Diagnosis not present

## 2017-04-13 DIAGNOSIS — Z23 Encounter for immunization: Secondary | ICD-10-CM | POA: Diagnosis not present

## 2017-04-13 DIAGNOSIS — E7800 Pure hypercholesterolemia, unspecified: Secondary | ICD-10-CM | POA: Diagnosis not present

## 2017-04-13 DIAGNOSIS — G4700 Insomnia, unspecified: Secondary | ICD-10-CM | POA: Diagnosis not present

## 2017-04-13 DIAGNOSIS — I10 Essential (primary) hypertension: Secondary | ICD-10-CM | POA: Diagnosis not present

## 2017-06-11 ENCOUNTER — Ambulatory Visit (INDEPENDENT_AMBULATORY_CARE_PROVIDER_SITE_OTHER): Payer: Medicare Other | Admitting: Cardiovascular Disease

## 2017-06-11 ENCOUNTER — Encounter: Payer: Self-pay | Admitting: Cardiovascular Disease

## 2017-06-11 VITALS — BP 148/80 | HR 50 | Ht 71.0 in | Wt 173.0 lb

## 2017-06-11 DIAGNOSIS — R001 Bradycardia, unspecified: Secondary | ICD-10-CM

## 2017-06-11 DIAGNOSIS — I10 Essential (primary) hypertension: Secondary | ICD-10-CM | POA: Diagnosis not present

## 2017-06-11 DIAGNOSIS — I2510 Atherosclerotic heart disease of native coronary artery without angina pectoris: Secondary | ICD-10-CM

## 2017-06-11 DIAGNOSIS — E785 Hyperlipidemia, unspecified: Secondary | ICD-10-CM | POA: Diagnosis not present

## 2017-06-11 MED ORDER — METOPROLOL TARTRATE 25 MG PO TABS: 12.5000 mg | ORAL_TABLET | Freq: Two times a day (BID) | ORAL | 6 refills | Status: AC

## 2017-06-11 NOTE — Assessment & Plan Note (Signed)
History of dyslipidemia on statin therapy with recent lipid profile performed on postoperative day 5/18 revealing a total cholesterol of 140, LDL 74 and HDL of 52.

## 2017-06-11 NOTE — Patient Instructions (Signed)
Medication Instructions: Your physician recommends that you continue on your current medications as directed. Please refer to the Current Medication list given to you today.  Decrease Metoprolol to 25 mg--take 12.5 mg (1/2 tab) twice daily.   Follow-Up: Your physician wants you to follow-up in: 1 year with Dr. Gwenlyn Found. You will receive a reminder letter in the mail two months in advance. If you don't receive a letter, please call our office to schedule the follow-up appointment.   Any Other Special Instructions are listed below:  Recommended Primary Care Physicians: --Rachell Cipro, MD --Emi Belfast, MD --Horald Pollen, MD  --Lang Snow, MD--[facility] --Berneta Sages, MD--[facility] Address: [address], [place], [place] [address]  Phone: [phone]  --Jani Gravel, MD--[facility] Address: [address], [place], [place] [address]  Phone: [phone]   If you need a refill on your cardiac medications before your next appointment, please call your pharmacy.

## 2017-06-11 NOTE — Assessment & Plan Note (Signed)
History of CAD status post LAD stenting by Dr. Claiborne Billings 04/07/96 with a Toma Aran PS 1530 stent. He was catheterized after positive stress test 01/29/11 revealing high-grade proximal LAD as well as AV groove circumflex disease both of which were stented with Rezulin drug-eluting stents. Previously placed LAD stent was widely patent. He denies chest pain or shortness of breath.

## 2017-06-11 NOTE — Assessment & Plan Note (Signed)
History of a symptomatic sinus bradycardia on metoprolol 25 mg by mouth twice a day. I'm going to reduce this and told have milligrams by mouth twice a day.

## 2017-06-11 NOTE — Assessment & Plan Note (Signed)
History of essential hypertension blood pressures measured at 148/80. He is on amlodipine, losartan and metoprolol. Continue current meds at current

## 2017-06-11 NOTE — Progress Notes (Signed)
06/11/2017 Evan [name]   [birth year]  [id number]  Primary Physician Evan Sheller, MD Primary Cardiologist: Evan Harp MD Evan [name], [place]  HPI:  Evan [name] is a 71 y.o.  , thin-appearing, married Asian male, father of 2, grandfather to 1 grandchild who is retired from [employer]. I last saw him  06/10/16. His wife is a Software engineer at [employer] He has a history of CAD status post LAD stenting by Evan [name], with a J&J PS-1530 stent. 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 He did have a follow up evaluation there 2 yrs later and was released from their service.. Because of complaints of an uncomfortable pressure sensation in his chest, a Myoview stress test performed January 16, 2011, showed mild to moderate septal ischemia. He was catheterized January 29, 2011, revealing high-grade proximal LAD as well as AV groove circumflex disease, both of which were stented with Resolute drug-eluting stents. His previously placed LAD stent was widely patent. He has been asymptomatic since. His other problems include hypertension and hyperlipidemia. Evan [name] follows his lipid profile. His most recent lipid profile performed by his PCP  04/06/17  revealed a total cholesterol of 140, LDL 74 and HDL of 52. Since I saw him last he denies chest pain or shortness of breath. He does travel quite a bit to [place] as well as [place] where his son and grandchildren live. He is about to travel to [place] and [facility] as well.      Current Meds  Medication Sig  . amLODipine (NORVASC) 5 MG tablet Take 5 mg by mouth daily.  Marland Kitchen aspirin 81 MG tablet Take 81 mg by mouth daily.  Marland Kitchen atorvastatin (LIPITOR) 20 MG tablet Take 20 mg by mouth daily.  . Calcium Carb-Cholecalciferol (CALCIUM 1000 + D PO) Take by mouth daily.  . Coenzyme Q10 (COQ10 PO) Take by mouth.  Marland Kitchen  glucosamine-chondroitin 500-400 MG tablet Take 1 tablet by mouth 3 (three) times daily. Reported on 10/15/2015  . losartan (COZAAR) 100 MG tablet Take 100 mg by mouth daily.  . metoprolol tartrate (LOPRESSOR) 25 MG tablet Take 0.5 tablets (12.5 mg total) by mouth 2 (two) times daily.  . Multiple Vitamin (MULTIVITAMIN) tablet Take 1 tablet by mouth daily.  . Omega-3 Fatty Acids (FISH OIL) 1200 MG CAPS Take by mouth daily.  . [DISCONTINUED] metoprolol (LOPRESSOR) 50 MG tablet Take 0.5 tablets by mouth 2 (two) times daily.     Allergies  Allergen Reactions  . Plavix [Clopidogrel Bisulfate]     Unknown reaction    Social History   Socioeconomic History  . Marital status: Married    Spouse name: Not on file  . Number of children: Not on file  . Years of education: Not on file  . Highest education level: Not on file  Social Needs  . Financial resource strain: Not on file  . Food insecurity - worry: Not on file  . Food insecurity - inability: Not on file  . Transportation needs - medical: Not on file  . Transportation needs - non-medical: Not on file  Occupational History  . Not on file  Tobacco Use  . Smoking status: Never Smoker  . Smokeless tobacco: Never Used  Substance and Sexual Activity  . Alcohol use: Yes    Alcohol/week: 0.0 oz    Comment: socially wine  .  Drug use: No  . Sexual activity: Not on file  Other Topics Concern  . Not on file  Social History Narrative  . Not on file     Review of Systems: General: negative for chills, fever, night sweats or weight changes.  Cardiovascular: negative for chest pain, dyspnea on exertion, edema, orthopnea, palpitations, paroxysmal nocturnal dyspnea or shortness of breath Dermatological: negative for rash Respiratory: negative for cough or wheezing Urologic: negative for hematuria Abdominal: negative for nausea, vomiting, diarrhea, bright red blood per rectum, melena, or hematemesis Neurologic: negative for visual changes,  syncope, or dizziness All other systems reviewed and are otherwise negative except as noted above.    Blood pressure (!) 148/80, pulse (!) 50, height 5\' 11"  (1.803 m), weight 173 lb (78.5 kg).  General appearance: alert and no distress Neck: no adenopathy, no carotid bruit, no JVD, supple, symmetrical, trachea midline and thyroid not enlarged, symmetric, no tenderness/mass/nodules Lungs: clear to auscultation bilaterally Heart: regular rate and rhythm, S1, S2 normal, no murmur, click, rub or gallop Extremities: extremities normal, atraumatic, no cyanosis or edema Pulses: 2+ and symmetric Skin: Skin color, texture, turgor normal. No rashes or lesions Neurologic: Alert and oriented X 3, normal strength and tone. Normal symmetric reflexes. Normal coordination and gait  EKG sinus bradycardia 50 without ST or T-wave changes. I personally reviewed this EKG.  ASSESSMENT AND PLAN:   CAD - LAD PCI '97, LAD/CFX DES 7/12 History of CAD status post LAD stenting by Dr. Claiborne Billings 04/07/96 with a Toma Aran PS 1530 stent. He was catheterized after positive stress test 01/29/11 revealing high-grade proximal LAD as well as AV groove circumflex disease both of which were stented with Rezulin drug-eluting stents. Previously placed LAD stent was widely patent. He denies chest pain or shortness of breath.  HTN (hypertension) History of essential hypertension blood pressures measured at 148/80. He is on amlodipine, losartan and metoprolol. Continue current meds at current  Dyslipidemia History of dyslipidemia on statin therapy with recent lipid profile performed on postoperative day 5/18 revealing a total cholesterol of 140, LDL 74 and HDL of 52.  Slow heart rate History of a symptomatic sinus bradycardia on metoprolol 25 mg by mouth twice a day. I'm going to reduce this and told have milligrams by mouth twice a day.      Evan Harp MD FACP,FACC,FAHA, [name] 06/11/2017 10:48 AM

## 2017-11-09 DIAGNOSIS — Z0000 Encounter for general adult medical examination without abnormal findings: Secondary | ICD-10-CM | POA: Diagnosis not present

## 2017-11-09 DIAGNOSIS — E7800 Pure hypercholesterolemia, unspecified: Secondary | ICD-10-CM | POA: Diagnosis not present

## 2017-11-09 DIAGNOSIS — Z125 Encounter for screening for malignant neoplasm of prostate: Secondary | ICD-10-CM | POA: Diagnosis not present

## 2017-11-09 DIAGNOSIS — I2510 Atherosclerotic heart disease of native coronary artery without angina pectoris: Secondary | ICD-10-CM | POA: Diagnosis not present

## 2017-11-09 DIAGNOSIS — I10 Essential (primary) hypertension: Secondary | ICD-10-CM | POA: Diagnosis not present

## 2017-11-09 DIAGNOSIS — R7309 Other abnormal glucose: Secondary | ICD-10-CM | POA: Diagnosis not present

## 2017-11-09 DIAGNOSIS — Z23 Encounter for immunization: Secondary | ICD-10-CM | POA: Diagnosis not present

## 2017-11-09 DIAGNOSIS — E785 Hyperlipidemia, unspecified: Secondary | ICD-10-CM | POA: Diagnosis not present

## 2017-11-11 DIAGNOSIS — Z0000 Encounter for general adult medical examination without abnormal findings: Secondary | ICD-10-CM | POA: Diagnosis not present

## 2017-11-11 DIAGNOSIS — N182 Chronic kidney disease, stage 2 (mild): Secondary | ICD-10-CM | POA: Diagnosis not present

## 2017-11-11 DIAGNOSIS — R7303 Prediabetes: Secondary | ICD-10-CM | POA: Diagnosis not present

## 2018-04-01 DIAGNOSIS — Z23 Encounter for immunization: Secondary | ICD-10-CM | POA: Diagnosis not present

## 2018-06-01 DIAGNOSIS — I10 Essential (primary) hypertension: Secondary | ICD-10-CM | POA: Diagnosis not present

## 2018-06-01 DIAGNOSIS — R7303 Prediabetes: Secondary | ICD-10-CM | POA: Diagnosis not present

## 2018-06-01 DIAGNOSIS — N182 Chronic kidney disease, stage 2 (mild): Secondary | ICD-10-CM | POA: Diagnosis not present

## 2018-06-01 DIAGNOSIS — E7800 Pure hypercholesterolemia, unspecified: Secondary | ICD-10-CM | POA: Diagnosis not present

## 2018-06-08 DIAGNOSIS — E785 Hyperlipidemia, unspecified: Secondary | ICD-10-CM | POA: Diagnosis not present

## 2018-06-08 DIAGNOSIS — R7303 Prediabetes: Secondary | ICD-10-CM | POA: Diagnosis not present

## 2018-06-08 DIAGNOSIS — N182 Chronic kidney disease, stage 2 (mild): Secondary | ICD-10-CM | POA: Diagnosis not present

## 2018-06-08 DIAGNOSIS — I129 Hypertensive chronic kidney disease with stage 1 through stage 4 chronic kidney disease, or unspecified chronic kidney disease: Secondary | ICD-10-CM | POA: Diagnosis not present

## 2018-06-21 ENCOUNTER — Ambulatory Visit (INDEPENDENT_AMBULATORY_CARE_PROVIDER_SITE_OTHER): Payer: Medicare Other | Admitting: Cardiovascular Disease

## 2018-06-21 ENCOUNTER — Encounter: Payer: Self-pay | Admitting: Cardiovascular Disease

## 2018-06-21 DIAGNOSIS — E785 Hyperlipidemia, unspecified: Secondary | ICD-10-CM | POA: Diagnosis not present

## 2018-06-21 DIAGNOSIS — I2510 Atherosclerotic heart disease of native coronary artery without angina pectoris: Secondary | ICD-10-CM

## 2018-06-21 DIAGNOSIS — I10 Essential (primary) hypertension: Secondary | ICD-10-CM | POA: Diagnosis not present

## 2018-06-21 NOTE — Assessment & Plan Note (Signed)
History of hyperlipidemia on statin therapy with lipid profile performed 06/01/2018 revealing total cholesterol 127, LDL of 63 and HDL 49.

## 2018-06-21 NOTE — Patient Instructions (Signed)
Medication Instructions:  Your physician recommends that you continue on your current medications as directed. Please refer to the Current Medication list given to you today.  If you need a refill on your cardiac medications before your next appointment, please call your pharmacy.   Lab work: NONE If you have labs (blood work) drawn today and your tests are completely normal, you will receive your results only by: . MyChart Message (if you have MyChart) OR . A paper copy in the mail If you have any lab test that is abnormal or we need to change your treatment, we will call you to review the results.  Testing/Procedures: NONE  Follow-Up: At [facility], you and your health needs are our priority.  As part of our continuing mission to provide you with exceptional heart care, we have created designated Provider Care Teams.  These Care Teams include your primary Cardiologist (physician) and Advanced Practice Providers (APPs -  Physician Assistants and Nurse Practitioners) who all work together to provide you with the care you need, when you need it. You will need a follow up appointment in 12 months.  Please call our office 2 months in advance to schedule this appointment.  You may see DR. BERRY or one of the following Advanced Practice Providers on your designated Care Team:   Luke Kilroy, PA-C Krista Kroeger, PA-C . Callie Goodrich, PA-C    

## 2018-06-21 NOTE — Progress Notes (Signed)
06/21/2018 Albertine Patricia Arts   31-May-1946  [id number]  Primary Physician Jani Gravel, MD Primary Cardiologist: Lorretta Harp MD [name], [place]  HPI:  Evan [name] is a 72 y.o.  thin-appearing, married Asian male, father of 2, grandfather to 1 grandchild who is retired from [employer]. I last saw him  06/11/2017. His wife is a Software engineer at [employer]. He has a history of CAD status post LAD stenting by Dr. Ellouise Newer April 07, 1996, with a J&J PS-1530 stent. 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 Kennith Gain at [facility] in 2010. He did have a follow up evaluation there 2 yrs later and was released from their service.. Because of complaints of an uncomfortable pressure sensation in his chest, a Myoview stress test performed January 16, 2011, showed mild to moderate septal ischemia. He was catheterized January 29, 2011, revealing high-grade proximal LAD as well as AV groove circumflex disease, both of which were stented with Resolute drug-eluting stents. His previously placed LAD stent was widely patent. He has been asymptomatic since. His other problems include hypertension and hyperlipidemia. Dr. Noah Delaine follows his lipid profile. His most recent lipid profile performed by his PCP  11/20/2019revealed a total cholesterol of 127, LDL of 63 and HDL 49.  He does travel quite a bit to [place] as [place] his [place] and grandchildren live.   Since I saw him a year ago he is remained stable.  He denies chest pain or shortness of breath.  Current Meds  Medication Sig  . amLODipine (NORVASC) 5 MG tablet Take 5 mg by mouth daily.  Marland Kitchen aspirin 81 MG tablet Take 81 mg by mouth daily.  Marland Kitchen atorvastatin (LIPITOR) 20 MG tablet Take 20 mg by mouth daily.  . Calcium Carb-Cholecalciferol (CALCIUM 1000 + D PO) Take by mouth daily.  . Coenzyme Q10 (COQ10 PO) Take by mouth.  Marland Kitchen glucosamine-chondroitin 500-400 MG tablet Take  1 tablet by mouth 3 (three) times daily. Reported on 10/15/2015  . losartan (COZAAR) 100 MG tablet Take 100 mg by mouth daily.  . metoprolol tartrate (LOPRESSOR) 25 MG tablet Take 0.5 tablets (12.5 mg total) by mouth 2 (two) times daily.  . Multiple Vitamin (MULTIVITAMIN) tablet Take 1 tablet by mouth daily.  . Omega-3 Fatty Acids (FISH OIL) 1200 MG CAPS Take by mouth daily.     Allergies  Allergen Reactions  . Plavix [Clopidogrel Bisulfate]     Unknown reaction    Social History   Socioeconomic History  . Marital status: Married    Spouse name: Not on file  . Number of children: Not on file  . Years of education: Not on file  . Highest education level: Not on file  Occupational History  . Not on file  Social Needs  . Financial resource strain: Not on file  . Food insecurity:    Worry: Not on file    Inability: Not on file  . Transportation needs:    Medical: Not on file    Non-medical: Not on file  Tobacco Use  . Smoking status: Never Smoker  . Smokeless tobacco: Never Used  Substance and Sexual Activity  . Alcohol use: Yes    Alcohol/week: 0.0 standard drinks    Comment: socially wine  . Drug use: No  . Sexual activity: Not on file  Lifestyle  . Physical activity:    Days per week: Not on  file    Minutes per session: Not on file  . Stress: Not on file  Relationships  . Social connections:    Talks on phone: Not on file    Gets together: Not on file    Attends religious service: Not on file    Active member of club or organization: Not on file    Attends meetings of clubs or organizations: Not on file    Relationship status: Not on file  . Intimate partner violence:    Fear of current or ex partner: Not on file    Emotionally abused: Not on file    Physically abused: Not on file    Forced sexual activity: Not on file  Other Topics Concern  . Not on file  Social History Narrative  . Not on file     Review of Systems: General: negative for chills, fever,  night sweats or weight changes.  Cardiovascular: negative for chest pain, dyspnea on exertion, edema, orthopnea, palpitations, paroxysmal nocturnal dyspnea or shortness of breath Dermatological: negative for rash Respiratory: negative for cough or wheezing Urologic: negative for hematuria Abdominal: negative for nausea, vomiting, diarrhea, bright red blood per rectum, melena, or hematemesis Neurologic: negative for visual changes, syncope, or dizziness All other systems reviewed and are otherwise negative except as noted above.    Blood pressure 130/66, pulse 72, height 5' 10.5" (1.791 m), weight 169 lb (76.7 kg).  General appearance: alert and no distress Neck: no adenopathy, no carotid bruit, no JVD, supple, symmetrical, trachea midline and thyroid not enlarged, symmetric, no tenderness/mass/nodules Lungs: clear to auscultation bilaterally Heart: regular rate and rhythm, S1, S2 normal, no murmur, click, rub or gallop Extremities: extremities normal, atraumatic, no cyanosis or edema Pulses: 2+ and symmetric Skin: Skin color, texture, turgor normal. No rashes or lesions Neurologic: Alert and oriented X 3, normal strength and tone. Normal symmetric reflexes. Normal coordination and gait  EKG sinus rhythm at 72 without ST or T wave changes.  I personally reviewed his EKG.  ASSESSMENT AND PLAN:   CAD - LAD PCI '97, LAD/CFX DES 7/12 History of CAD status post LAD stenting by Dr. Claiborne Billings 04/07/1996 with a J&J PS-1530 stent.  I catheterized him 01/29/2011 revealing high-grade proximal LAD as well as AV groove circumflex disease both of which were stented with resolute drug-eluting stents.  His previously placed LAD stent was widely patent.  He denies chest pain or shortness of breath.  HTN (hypertension) History of essential hypertension her blood pressure measured today 130/66.  He is on Norvasc, losartan and metoprolol  Dyslipidemia History of hyperlipidemia on statin therapy with lipid  profile performed 06/01/2018 revealing total cholesterol 127, LDL of 63 and HDL 49.      Lorretta Harp MD FACP,FACC,FAHA, [facility] 06/21/2018 4:19 PM

## 2018-06-21 NOTE — Assessment & Plan Note (Signed)
History of essential hypertension her blood pressure measured today 130/66.  He is on Norvasc, losartan and metoprolol

## 2018-06-21 NOTE — Assessment & Plan Note (Signed)
History of CAD status post LAD stenting by Dr. Claiborne Billings 04/07/1996 with a J&J PS-1530 stent.  I catheterized him 01/29/2011 revealing high-grade proximal LAD as well as AV groove circumflex disease both of which were stented with resolute drug-eluting stents.  His previously placed LAD stent was widely patent.  He denies chest pain or shortness of breath.

## 2018-11-07 DIAGNOSIS — I10 Essential (primary) hypertension: Secondary | ICD-10-CM | POA: Diagnosis not present

## 2018-11-07 DIAGNOSIS — E785 Hyperlipidemia, unspecified: Secondary | ICD-10-CM | POA: Diagnosis not present

## 2018-11-07 DIAGNOSIS — N182 Chronic kidney disease, stage 2 (mild): Secondary | ICD-10-CM | POA: Diagnosis not present

## 2018-11-07 DIAGNOSIS — I129 Hypertensive chronic kidney disease with stage 1 through stage 4 chronic kidney disease, or unspecified chronic kidney disease: Secondary | ICD-10-CM | POA: Diagnosis not present

## 2018-11-07 DIAGNOSIS — R7309 Other abnormal glucose: Secondary | ICD-10-CM | POA: Diagnosis not present

## 2018-11-07 DIAGNOSIS — R809 Proteinuria, unspecified: Secondary | ICD-10-CM | POA: Diagnosis not present

## 2018-11-14 DIAGNOSIS — E785 Hyperlipidemia, unspecified: Secondary | ICD-10-CM | POA: Diagnosis not present

## 2018-11-14 DIAGNOSIS — N182 Chronic kidney disease, stage 2 (mild): Secondary | ICD-10-CM | POA: Diagnosis not present

## 2018-11-14 DIAGNOSIS — I129 Hypertensive chronic kidney disease with stage 1 through stage 4 chronic kidney disease, or unspecified chronic kidney disease: Secondary | ICD-10-CM | POA: Diagnosis not present

## 2018-11-14 DIAGNOSIS — R7309 Other abnormal glucose: Secondary | ICD-10-CM | POA: Diagnosis not present

## 2018-11-14 DIAGNOSIS — I2510 Atherosclerotic heart disease of native coronary artery without angina pectoris: Secondary | ICD-10-CM | POA: Diagnosis not present

## 2018-11-14 DIAGNOSIS — Z0000 Encounter for general adult medical examination without abnormal findings: Secondary | ICD-10-CM | POA: Diagnosis not present

## 2019-01-24 ENCOUNTER — Telehealth: Payer: Self-pay | Admitting: *Deleted

## 2019-01-24 NOTE — Telephone Encounter (Signed)
Forward to [facility] pharmacist- [facility] for recommendation and then contact patient

## 2019-01-24 NOTE — Telephone Encounter (Signed)
Patient left a msg on the refill vm stating that [facility] in [place] informed him that the losartan is unavailable at this time. Pt would like to know what he can take to replace this medication. He can be reached at [phone]. Thanks, MI

## 2019-01-25 MED ORDER — VALSARTAN 160 MG PO TABS: 160.0000 mg | ORAL_TABLET | Freq: Every day | ORAL | 1 refills | Status: DC

## 2019-01-25 NOTE — Telephone Encounter (Signed)
Talked to pharmacy. Losartan not available in any strength.  Will change losartan to valsartan 160mg .

## 2019-02-13 ENCOUNTER — Other Ambulatory Visit: Payer: Self-pay

## 2019-04-07 DIAGNOSIS — Z23 Encounter for immunization: Secondary | ICD-10-CM | POA: Diagnosis not present

## 2019-05-10 DIAGNOSIS — I10 Essential (primary) hypertension: Secondary | ICD-10-CM | POA: Diagnosis not present

## 2019-05-17 DIAGNOSIS — I2510 Atherosclerotic heart disease of native coronary artery without angina pectoris: Secondary | ICD-10-CM | POA: Diagnosis not present

## 2019-05-17 DIAGNOSIS — E785 Hyperlipidemia, unspecified: Secondary | ICD-10-CM | POA: Diagnosis not present

## 2019-05-17 DIAGNOSIS — I10 Essential (primary) hypertension: Secondary | ICD-10-CM | POA: Diagnosis not present

## 2019-05-17 DIAGNOSIS — R7309 Other abnormal glucose: Secondary | ICD-10-CM | POA: Diagnosis not present

## 2019-05-17 DIAGNOSIS — N182 Chronic kidney disease, stage 2 (mild): Secondary | ICD-10-CM | POA: Diagnosis not present

## 2019-05-19 DIAGNOSIS — H2513 Age-related nuclear cataract, bilateral: Secondary | ICD-10-CM | POA: Diagnosis not present

## 2019-05-23 DIAGNOSIS — I10 Essential (primary) hypertension: Secondary | ICD-10-CM | POA: Diagnosis not present

## 2019-05-23 DIAGNOSIS — H2512 Age-related nuclear cataract, left eye: Secondary | ICD-10-CM | POA: Diagnosis not present

## 2019-05-23 DIAGNOSIS — H269 Unspecified cataract: Secondary | ICD-10-CM | POA: Diagnosis not present

## 2019-05-24 DIAGNOSIS — H2511 Age-related nuclear cataract, right eye: Secondary | ICD-10-CM | POA: Diagnosis not present

## 2019-05-30 DIAGNOSIS — I10 Essential (primary) hypertension: Secondary | ICD-10-CM | POA: Diagnosis not present

## 2019-05-30 DIAGNOSIS — I2510 Atherosclerotic heart disease of native coronary artery without angina pectoris: Secondary | ICD-10-CM | POA: Diagnosis not present

## 2019-05-30 DIAGNOSIS — H269 Unspecified cataract: Secondary | ICD-10-CM | POA: Diagnosis not present

## 2019-05-30 DIAGNOSIS — H2511 Age-related nuclear cataract, right eye: Secondary | ICD-10-CM | POA: Diagnosis not present

## 2019-06-06 ENCOUNTER — Ambulatory Visit (INDEPENDENT_AMBULATORY_CARE_PROVIDER_SITE_OTHER): Payer: Medicare Other | Admitting: Cardiovascular Disease

## 2019-06-06 ENCOUNTER — Other Ambulatory Visit: Payer: Self-pay

## 2019-06-06 ENCOUNTER — Encounter: Payer: Self-pay | Admitting: Cardiovascular Disease

## 2019-06-06 DIAGNOSIS — I10 Essential (primary) hypertension: Secondary | ICD-10-CM

## 2019-06-06 DIAGNOSIS — I2510 Atherosclerotic heart disease of native coronary artery without angina pectoris: Secondary | ICD-10-CM

## 2019-06-06 DIAGNOSIS — E785 Hyperlipidemia, unspecified: Secondary | ICD-10-CM | POA: Diagnosis not present

## 2019-06-06 NOTE — Assessment & Plan Note (Signed)
History of essential hypertension with blood pressure measured initially at 151/89 with recheck of 144/84.  He is on amlodipine, losartan and metoprolol.  He has had some proteinuria and wishes to start be addressed.  He thinks it may be related to medication.  I suggested that he see a nephrologist.

## 2019-06-06 NOTE — Assessment & Plan Note (Addendum)
History of dyslipidemia on atorvastatin followed by his PCP.  Performed 11/07/2018 revealed total cholesterol 156, LDL of 82 and HDL 54.

## 2019-06-06 NOTE — Assessment & Plan Note (Signed)
History of CAD status post LAD stenting by Dr. Ellouise Newer April 07, 1996 with a J&J PS-1530 stent.  Because of complaints of chest pain a Myoview stress test was performed 01/16/2011 that showed mild to moderate septal ischemia.  He was catheterized 01/29/2011 revealing high-grade proximal LAD as well as AV groove circumflex disease both of which were stented with resolute drug-eluting stents.  The previously placed LAD stent was widely patent.  He otherwise has remained asymptomatic.

## 2019-06-06 NOTE — Progress Notes (Addendum)
06/06/2019 Evan [name]   1945-11-27  [id number]  Primary Physician Evan Gravel, MD Primary Cardiologist: Evan Harp MD Evan [name], [place]  HPI:  Evan [name] is a 73 y.o.  thin-appearing, married Asian male, father of 2, grandfather to 1 grandchild who is retired from [employer]. I last saw him  06/21/2018. His wife is a Software engineer at [employer]. He has a history of CAD status post LAD stenting by Dr. Ellouise [name] April 07, 1996, with a J&J PS-1530 stent. 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 Kennith [name] at [facility] in 2010. He did have a follow up evaluation there 2 yrs later and was released from their service.. Because of complaints of an uncomfortable pressure sensation in his chest, a Myoview stress test performed January 16, 2011, showed mild to moderate septal ischemia. He was catheterized January 29, 2011, revealing high-grade proximal LAD as well as AV groove circumflex disease, both of which were stented with Resolute drug-eluting stents. His previously placed LAD stent was widely patent. He has been asymptomatic since. His other problems include hypertension and hyperlipidemia.  He does travel quite a bit to [place] as well as [place] [name] his son and grandchildren live.  Since I saw him a year ago he is remained stable.  He denies chest pain or shortness of breath.    Current Meds  Medication Sig  . amLODipine (NORVASC) 5 MG tablet Take 5 mg by mouth daily.  Marland Kitchen aspirin 81 MG tablet Take 81 mg by mouth daily.  Marland Kitchen atorvastatin (LIPITOR) 20 MG tablet Take 20 mg by mouth daily.  . Calcium Carb-Cholecalciferol (CALCIUM 1000 + D PO) Take by mouth daily.  . Coenzyme Q10 (COQ10 PO) Take by mouth.  Marland Kitchen glucosamine-chondroitin 500-400 MG tablet Take 1 tablet by mouth 3 (three) times daily. Reported on 10/15/2015  . metoprolol tartrate (LOPRESSOR) 25 MG tablet Take 0.5 tablets (12.5 mg total) by mouth 2  (two) times daily.  . Multiple Vitamin (MULTIVITAMIN) tablet Take 1 tablet by mouth daily.  . Omega-3 Fatty Acids (FISH OIL) 1200 MG CAPS Take by mouth daily.  . valsartan (DIOVAN) 160 MG tablet Take 1 tablet (160 mg total) by mouth daily. To replace losartan     Allergies  Allergen Reactions  . Plavix [Clopidogrel Bisulfate]     Unknown reaction    Social History   Socioeconomic History  . Marital status: Married    Spouse name: Not on file  . Number of children: Not on file  . Years of education: Not on file  . Highest education level: Not on file  Occupational History  . Not on file  Social Needs  . Financial resource strain: Not on file  . Food insecurity    Worry: Not on file    Inability: Not on file  . Transportation needs    Medical: Not on file    Non-medical: Not on file  Tobacco Use  . Smoking status: Never Smoker  . Smokeless tobacco: Never Used  Substance and Sexual Activity  . Alcohol use: Yes    Alcohol/week: 0.0 standard drinks    Comment: socially wine  . Drug use: No  . Sexual activity: Not on file  Lifestyle  . Physical activity    Days per week: Not on file    Minutes per session: Not on file  . Stress: Not on file  Relationships  . Social  connections    Talks on phone: Not on file    Gets together: Not on file    Attends religious service: Not on file    Active member of club or organization: Not on file    Attends meetings of clubs or organizations: Not on file    Relationship status: Not on file  . Intimate partner violence    Fear of current or ex partner: Not on file    Emotionally abused: Not on file    Physically abused: Not on file    Forced sexual activity: Not on file  Other Topics Concern  . Not on file  Social History Narrative  . Not on file     Review of Systems: General: negative for chills, fever, night sweats or weight changes.  Cardiovascular: negative for chest pain, dyspnea on exertion, edema, orthopnea,  palpitations, paroxysmal nocturnal dyspnea or shortness of breath Dermatological: negative for rash Respiratory: negative for cough or wheezing Urologic: negative for hematuria Abdominal: negative for nausea, vomiting, diarrhea, bright red blood per rectum, melena, or hematemesis Neurologic: negative for visual changes, syncope, or dizziness All other systems reviewed and are otherwise negative except as noted above.    Blood pressure (!) 142/84, pulse 72, temperature (!) 96.9 F (36.1 C), height 5' 10.5" (1.791 m), weight 170 lb (77.1 kg), SpO2 100 %.  General appearance: alert and no distress Neck: no adenopathy, no carotid bruit, no JVD, supple, symmetrical, trachea midline and thyroid not enlarged, symmetric, no tenderness/mass/nodules Lungs: clear to auscultation bilaterally Heart: regular rate and rhythm, S1, S2 normal, no murmur, click, rub or gallop Extremities: extremities normal, atraumatic, no cyanosis or edema Pulses: 2+ and symmetric Skin: Skin color, texture, turgor normal. No rashes or lesions Neurologic: Alert and oriented X 3, normal strength and tone. Normal symmetric reflexes. Normal coordination and gait  EKG sinus rhythm at 70 without ST or T wave changes.  I personally reviewed this EKG.  ASSESSMENT AND PLAN:   CAD - LAD PCI '97, LAD/CFX DES 7/12 History of CAD status post LAD stenting by Dr. Ellouise [name] April 07, 1996 with a J&J PS-1530 stent.  Because of complaints of chest pain a Myoview stress test was performed 01/16/2011 that showed mild to moderate septal ischemia.  He was catheterized 01/29/2011 revealing high-grade proximal LAD as well as AV groove circumflex disease both of which were stented with resolute drug-eluting stents.  The previously placed LAD stent was widely patent.  He otherwise has remained asymptomatic.  HTN (hypertension) History of essential hypertension with blood pressure measured initially at 151/89 with recheck of 144/84.  He is on  amlodipine, losartan and metoprolol.  He has had some proteinuria and wishes to start be addressed.  He thinks it may be related to medication.  I suggested that he see a nephrologist.  Dyslipidemia History of dyslipidemia on atorvastatin followed by his PCP.  Performed 11/07/2018 revealed total cholesterol 156, LDL of 82 and HDL 54.      Evan Harp MD [facility], [facility] 06/06/2019 3:07 PM

## 2019-06-06 NOTE — Patient Instructions (Signed)
Medication Instructions:  Your physician recommends that you continue on your current medications as directed. Please refer to the Current Medication list given to you today.  If you need a refill on your cardiac medications before your next appointment, please call your pharmacy.   Lab work: NONE  Testing/Procedures: NONE  Follow-Up: At [facility], you and your health needs are our priority.  As part of our continuing mission to provide you with exceptional heart care, we have created designated Provider Care Teams.  These Care Teams include your primary Cardiologist (physician) and Advanced Practice Providers (APPs -  Physician Assistants and Nurse Practitioners) who all work together to provide you with the care you need, when you need it. You may see Dr Berry or one of the following Advanced Practice Providers on your designated Care Team:    Luke Kilroy, PA-C  Callie Goodrich, PA-C  Jesse Cleaver, FNP  Your physician wants you to follow-up in: 1 year. You will receive a reminder letter in the mail two months in advance. If you don't receive a letter, please call our office to schedule the follow-up appointment.      

## 2019-06-06 NOTE — Addendum Note (Signed)
Addended by: Cain Sieve on: 06/06/2019 03:08 PM   Modules accepted: Orders

## 2019-06-16 DIAGNOSIS — I10 Essential (primary) hypertension: Secondary | ICD-10-CM | POA: Diagnosis not present

## 2019-08-10 DIAGNOSIS — Z23 Encounter for immunization: Secondary | ICD-10-CM | POA: Diagnosis not present

## 2019-09-05 DIAGNOSIS — Z23 Encounter for immunization: Secondary | ICD-10-CM | POA: Diagnosis not present

## 2019-11-07 DIAGNOSIS — I10 Essential (primary) hypertension: Secondary | ICD-10-CM | POA: Diagnosis not present

## 2019-11-07 DIAGNOSIS — R7309 Other abnormal glucose: Secondary | ICD-10-CM | POA: Diagnosis not present

## 2019-11-14 DIAGNOSIS — N182 Chronic kidney disease, stage 2 (mild): Secondary | ICD-10-CM | POA: Diagnosis not present

## 2019-11-14 DIAGNOSIS — E785 Hyperlipidemia, unspecified: Secondary | ICD-10-CM | POA: Diagnosis not present

## 2019-11-14 DIAGNOSIS — I209 Angina pectoris, unspecified: Secondary | ICD-10-CM | POA: Diagnosis not present

## 2019-11-14 DIAGNOSIS — I129 Hypertensive chronic kidney disease with stage 1 through stage 4 chronic kidney disease, or unspecified chronic kidney disease: Secondary | ICD-10-CM | POA: Diagnosis not present

## 2019-11-14 DIAGNOSIS — R7309 Other abnormal glucose: Secondary | ICD-10-CM | POA: Diagnosis not present

## 2019-11-14 DIAGNOSIS — I2510 Atherosclerotic heart disease of native coronary artery without angina pectoris: Secondary | ICD-10-CM | POA: Diagnosis not present

## 2019-11-14 DIAGNOSIS — G4700 Insomnia, unspecified: Secondary | ICD-10-CM | POA: Diagnosis not present

## 2020-04-05 DIAGNOSIS — Z23 Encounter for immunization: Secondary | ICD-10-CM | POA: Diagnosis not present

## 2020-04-23 DIAGNOSIS — Z23 Encounter for immunization: Secondary | ICD-10-CM | POA: Diagnosis not present

## 2020-04-29 DIAGNOSIS — H26492 Other secondary cataract, left eye: Secondary | ICD-10-CM | POA: Diagnosis not present

## 2020-05-03 DIAGNOSIS — H26491 Other secondary cataract, right eye: Secondary | ICD-10-CM | POA: Diagnosis not present

## 2020-05-09 DIAGNOSIS — I10 Essential (primary) hypertension: Secondary | ICD-10-CM | POA: Diagnosis not present

## 2020-05-09 DIAGNOSIS — E785 Hyperlipidemia, unspecified: Secondary | ICD-10-CM | POA: Diagnosis not present

## 2020-05-16 DIAGNOSIS — R7309 Other abnormal glucose: Secondary | ICD-10-CM | POA: Diagnosis not present

## 2020-05-16 DIAGNOSIS — E7800 Pure hypercholesterolemia, unspecified: Secondary | ICD-10-CM | POA: Diagnosis not present

## 2020-05-16 DIAGNOSIS — Z125 Encounter for screening for malignant neoplasm of prostate: Secondary | ICD-10-CM | POA: Diagnosis not present

## 2020-05-16 DIAGNOSIS — G4700 Insomnia, unspecified: Secondary | ICD-10-CM | POA: Diagnosis not present

## 2020-05-16 DIAGNOSIS — N182 Chronic kidney disease, stage 2 (mild): Secondary | ICD-10-CM | POA: Diagnosis not present

## 2020-05-16 DIAGNOSIS — I2510 Atherosclerotic heart disease of native coronary artery without angina pectoris: Secondary | ICD-10-CM | POA: Diagnosis not present

## 2020-05-16 DIAGNOSIS — I209 Angina pectoris, unspecified: Secondary | ICD-10-CM | POA: Diagnosis not present

## 2020-05-16 DIAGNOSIS — Z0000 Encounter for general adult medical examination without abnormal findings: Secondary | ICD-10-CM | POA: Diagnosis not present

## 2020-05-16 DIAGNOSIS — I10 Essential (primary) hypertension: Secondary | ICD-10-CM | POA: Diagnosis not present

## 2020-06-25 ENCOUNTER — Ambulatory Visit (INDEPENDENT_AMBULATORY_CARE_PROVIDER_SITE_OTHER): Payer: Medicare Other | Admitting: Cardiology

## 2020-06-25 ENCOUNTER — Encounter: Payer: Self-pay | Admitting: Cardiology

## 2020-06-25 ENCOUNTER — Other Ambulatory Visit: Payer: Self-pay

## 2020-06-25 VITALS — BP 150/68 | HR 67 | Ht 70.5 in | Wt 171.0 lb

## 2020-06-25 DIAGNOSIS — I10 Essential (primary) hypertension: Secondary | ICD-10-CM

## 2020-06-25 DIAGNOSIS — I2510 Atherosclerotic heart disease of native coronary artery without angina pectoris: Secondary | ICD-10-CM | POA: Diagnosis not present

## 2020-06-25 DIAGNOSIS — R0989 Other specified symptoms and signs involving the circulatory and respiratory systems: Secondary | ICD-10-CM | POA: Diagnosis not present

## 2020-06-25 DIAGNOSIS — E785 Hyperlipidemia, unspecified: Secondary | ICD-10-CM

## 2020-06-25 DIAGNOSIS — I739 Peripheral vascular disease, unspecified: Secondary | ICD-10-CM

## 2020-06-25 NOTE — Assessment & Plan Note (Addendum)
renal artery aneurysm bypass [facility]) 2011 RCA bruit today on exam-check dopplers

## 2020-06-25 NOTE — Progress Notes (Signed)
Cardiology Office Note:    Date:  06/25/2020   ID:  Evan [name], DOB [birth year], MRN [id number]  PCP:  Jani Gravel, MD  Cardiologist:  No primary care provider on file.  Electrophysiologist:  None   Referring MD: Jani Gravel, MD   Chief Complaint  Patient presents with  . Follow-up    1 year.    History of Present Illness:    Evan [name] is a 74 y.o. male with a hx of CAD. He had a remote PCI and stenting of his LAD in 1997 by Dr. Claiborne Billings. He has a history of a renal artery aneurysm which was resected at [facility] in 2010. In 2012 he had recurrent chest pain and an abnormal nuclear stress. Catheterization was done July 2012 and he underwent LAD and circumflex PCI with DES. His previously placed LAD stent was patent. He is done well since from a cardiac standpoint. He is in the office today for routine follow-up.  Patient has a history of hypertension. He brought in labs from his primary care provider. The patient mentioned that there was some protein in his urine. His creatinine is 1.1, his GFR was 68. He was told he may have mild renal disease but that his medications were protective of further damage. The patient has been making sure he is hydrated. Blood pressure by me in the office was 158/62. The patient denies any chest pain or unusual shortness of breath. He is walking 30 minutes a day.  Past Medical History:  Diagnosis Date  . Aneurysm artery, renal [facility]) 2010   Surgery 2010 at [facility]  . Coronary artery disease sept 1997   LAD PCI '97, LAD/CFX DES 7/12  . Hyperlipidemia    PCP follows  . Hypertension   . OSA on CPAP 07/2008   does not have CPAP  . Sleep apnea    Does not have CPAP  . Vertigo     Past Surgical History:  Procedure Laterality Date  . COLONOSCOPY     [facility]   . CORONARY STENT PLACEMENT  04/06/96 & 01/29/11   LAD PCI '97, LAD/CFX DES 7/12  . DOPPLER ECHOCARDIOGRAPHY  03/09/2008   EF 50-55%  . NM MYOCAR PERF WALL MOTION  03/09/2008   subtle  septal ischemia which decide to treat mediclly  . NM MYOCAR PERF WALL MOTION  01/16/2011   showed mild to moderate septal ischemia  . renal arterial aneursym  05/21/09   surgerical resectd by Dr Kennith Gain at [facility]    Current Medications: Current Meds  Medication Sig  . amLODipine (NORVASC) 5 MG tablet Take 5 mg by mouth daily.  Marland Kitchen aspirin 81 MG tablet Take 81 mg by mouth daily.  Marland Kitchen atorvastatin (LIPITOR) 20 MG tablet Take 20 mg by mouth daily.  . Calcium Carb-Cholecalciferol (CALCIUM 1000 + D PO) Take by mouth daily.  . Coenzyme Q10 (COQ10 PO) Take by mouth.  Marland Kitchen glucosamine-chondroitin 500-400 MG tablet Take 1 tablet by mouth 3 (three) times daily. Reported on 10/15/2015  . metoprolol tartrate (LOPRESSOR) 25 MG tablet Take 0.5 tablets (12.5 mg total) by mouth 2 (two) times daily.  . Multiple Vitamin (MULTIVITAMIN) tablet Take 1 tablet by mouth daily.  . Omega-3 Fatty Acids (FISH OIL) 1200 MG CAPS Take by mouth daily.  . [DISCONTINUED] valsartan (DIOVAN) 160 MG tablet Take 1 tablet (160 mg total) by mouth daily. To replace losartan     Allergies:   Plavix [clopidogrel bisulfate]   Social History  Socioeconomic History  . Marital status: Married    Spouse name: Not on file  . Number of children: Not on file  . Years of education: Not on file  . Highest education level: Not on file  Occupational History  . Not on file  Tobacco Use  . Smoking status: Never Smoker  . Smokeless tobacco: Never Used  Substance and Sexual Activity  . Alcohol use: Yes    Alcohol/week: 0.0 standard drinks    Comment: socially wine  . Drug use: No  . Sexual activity: Not on file  Other Topics Concern  . Not on file  Social History Narrative  . Not on file   Social Determinants of Health   Financial Resource Strain: Not on file  Food Insecurity: Not on file  Transportation Needs: Not on file  Physical Activity: Not on file  Stress: Not on file  Social Connections: Not on file      Family History: The patient's family history includes Hypertension in his mother. There is no history of Colon polyps, Rectal cancer, Stomach cancer, or Esophageal cancer.  ROS:   Please see the history of present illness.     All other systems reviewed and are negative.  EKGs/Labs/Other Studies Reviewed:    The following studies were reviewed today:   EKG:  EKG is ordered today.  The ekg ordered today demonstrates NSR, HR 67  Recent Labs: No results found for requested labs within last 8760 hours.  Recent Lipid Panel No results found for: CHOL, TRIG, HDL, CHOLHDL, VLDL, LDLCALC, LDLDIRECT  Physical Exam:    VS:  BP (!) 150/68 (BP Location: Left Arm, Patient Position: Sitting, Cuff Size: Normal)   Pulse 67   Ht 5' 10.5" (1.791 m)   Wt 171 lb (77.6 kg)   BMI 24.19 kg/m     Wt Readings from Last 3 Encounters:  06/25/20 171 lb (77.6 kg)  06/06/19 170 lb (77.1 kg)  06/21/18 169 lb (76.7 kg)     GEN:  Well nourished, well developed in no acute distress HEENT: Normal NECK: No JVD; RCA bruit LYMPHATICS: No lymphadenopathy CARDIAC: RRR, no murmurs, rubs, gallops RESPIRATORY:  Clear to auscultation without rales, wheezing or rhonchi  ABDOMEN: Soft,  non-distended MUSCULOSKELETAL:  No edema; No deformity  SKIN: Warm and dry NEUROLOGIC:  Alert and oriented x 3 PSYCHIATRIC:  Normal affect   ASSESSMENT:    HTN (hypertension) Repeat B/P by me 158/62-I have asked the patient to monitor his blood pressure at home for the next few weeks. He should return in 4 to 6 weeks with these readings to discuss with Dr. Alvester Chou whether his medications need to be adjusted.  PVD  renal artery aneurysm bypass [facility]) 2011 RCA bruit today on exam-check dopplers  Dyslipidemia LDL 58 on statin rx  PLAN:    Monitor B/P and f/u with Dr Gwenlyn Found- goal < 135/85. Check carotid dopplers   Medication Adjustments/Labs and Tests Ordered: Current medicines are reviewed at length with the  patient today.  Concerns regarding medicines are outlined above.  Orders Placed This Encounter  Procedures  . EKG 12-Lead  . VAS US CAROTID   No orders of the defined types were placed in this encounter.   Patient Instructions  Medication Instructions:  Continue current medications  *If you need a refill on your cardiac medications before your next appointment, please call your pharmacy*   Lab Work: None Ordered   Testing/Procedures: Your physician has requested that you have a carotid  duplex. This test is an ultrasound of the carotid arteries in your neck. It looks at blood flow through these arteries that supply the brain with blood. Allow one hour for this exam. There are no restrictions or special instructions.   Follow-Up: At [facility], you and your health needs are our priority.  As part of our continuing mission to provide you with exceptional heart care, we have created designated Provider Care Teams.  These Care Teams include your primary Cardiologist (physician) and Advanced Practice Providers (APPs -  Physician Assistants and Nurse Practitioners) who all work together to provide you with the care you need, when you need it.  We recommend signing up for the patient portal called "MyChart".  Sign up information is provided on this After Visit Summary.  MyChart is used to connect with patients for Virtual Visits (Telemedicine).  Patients are able to view lab/test results, encounter notes, upcoming appointments, etc.  Non-urgent messages can be sent to your provider as well.   To learn more about what you can do with MyChart, go to [email or web address].    Your next appointment:   6 week(s)  The format for your next appointment:   In Person  Provider:   You may see Quay Burow, MD  or one of the following Advanced Practice Providers on your designated Care Team:           Signed, Susy Manor  06/25/2020 3:18 PM    [place]

## 2020-06-25 NOTE — Assessment & Plan Note (Signed)
Repeat B/P by me 158/62-I have asked the patient to monitor his blood pressure at home for the next few weeks. He should return in 4 to 6 weeks with these readings to discuss with Dr. Alvester Chou whether his medications need to be adjusted.

## 2020-06-25 NOTE — Assessment & Plan Note (Signed)
LDL 58 on statin rx

## 2020-06-25 NOTE — Patient Instructions (Signed)
Medication Instructions:  Continue current medications  *If you need a refill on your cardiac medications before your next appointment, please call your pharmacy*   Lab Work: None Ordered   Testing/Procedures: Your physician has requested that you have a carotid duplex. This test is an ultrasound of the carotid arteries in your neck. It looks at blood flow through these arteries that supply the brain with blood. Allow one hour for this exam. There are no restrictions or special instructions.   Follow-Up: At [facility], you and your health needs are our priority.  As part of our continuing mission to provide you with exceptional heart care, we have created designated Provider Care Teams.  These Care Teams include your primary Cardiologist (physician) and Advanced Practice Providers (APPs -  Physician Assistants and Nurse Practitioners) who all work together to provide you with the care you need, when you need it.  We recommend signing up for the patient portal called "MyChart".  Sign up information is provided on this After Visit Summary.  MyChart is used to connect with patients for Virtual Visits (Telemedicine).  Patients are able to view lab/test results, encounter notes, upcoming appointments, etc.  Non-urgent messages can be sent to your provider as well.   To learn more about what you can do with MyChart, go to [email or web address].    Your next appointment:   6 week(s)  The format for your next appointment:   In Person  Provider:   You may see Quay Burow, MD  or one of the following Advanced Practice Providers on your designated Care Team:

## 2020-07-11 ENCOUNTER — Other Ambulatory Visit (HOSPITAL_COMMUNITY): Payer: Self-pay | Admitting: Cardiology

## 2020-07-11 ENCOUNTER — Ambulatory Visit (HOSPITAL_COMMUNITY)
Admission: RE | Admit: 2020-07-11 | Discharge: 2020-07-11 | Disposition: A | Discharge status: Home / Self Care | Payer: Medicare Other | Source: Ambulatory Visit | Attending: Cardiology | Admitting: Cardiology

## 2020-07-11 ENCOUNTER — Other Ambulatory Visit: Payer: Self-pay

## 2020-07-11 DIAGNOSIS — I6523 Occlusion and stenosis of bilateral carotid arteries: Secondary | ICD-10-CM

## 2020-07-11 DIAGNOSIS — R0989 Other specified symptoms and signs involving the circulatory and respiratory systems: Secondary | ICD-10-CM

## 2020-07-17 ENCOUNTER — Telehealth: Payer: Self-pay

## 2020-07-17 NOTE — Telephone Encounter (Addendum)
Left voice message asking patient to give office a call for his recent ultrasound.  ----- Message from Abelino Derrick, PA-C sent at 07/12/2020  9:02 AM EST ----- Please let the patient know I reviewed his carotid Doppler study.  He has mild narrowing in those arteries.  He is on the correct treatment.  He should keep his follow-up with Dr. Allyson Sabal as scheduled.  Corine Shelter PA-C 07/12/2020 9:02 AM

## 2020-08-20 ENCOUNTER — Encounter: Payer: Self-pay | Admitting: Cardiovascular Disease

## 2020-08-20 ENCOUNTER — Other Ambulatory Visit: Payer: Self-pay

## 2020-08-20 ENCOUNTER — Ambulatory Visit (INDEPENDENT_AMBULATORY_CARE_PROVIDER_SITE_OTHER): Payer: Medicare Other | Admitting: Cardiovascular Disease

## 2020-08-20 DIAGNOSIS — E785 Hyperlipidemia, unspecified: Secondary | ICD-10-CM

## 2020-08-20 DIAGNOSIS — I2510 Atherosclerotic heart disease of native coronary artery without angina pectoris: Secondary | ICD-10-CM | POA: Diagnosis not present

## 2020-08-20 DIAGNOSIS — I10 Essential (primary) hypertension: Secondary | ICD-10-CM | POA: Diagnosis not present

## 2020-08-20 NOTE — Assessment & Plan Note (Signed)
History of essential hypertension a blood pressure measured today 150/79.  I did review his blood pressure log which showed blood pressures much better than this at home which he measures on a daily basis.  He is on amlodipine, losartan and metoprolol.

## 2020-08-20 NOTE — Assessment & Plan Note (Signed)
History of dyslipidemia on statin therapy with lipid profile performed 05/01/2020 revealing total cholesterol 127, LDL 58 and HDL 52.

## 2020-08-20 NOTE — Progress Notes (Signed)
08/20/2020 Evan [name]   28-Feb-1946  [id number]  Primary Physician Jani Gravel, MD Primary Cardiologist: Lorretta Harp MD [name], [place]  HPI:  Evan [name] is a 75 y.o.   thin-appearing, married Asian male, father of 2, grandfather to 1 grandchild who is retired from [employer]. I last saw him11/24/2020. His wife is a Software engineer at [employer]. He has a history of CAD status post LAD stenting by Dr. Ellouise Newer April 07, 1996, with a J&J PS-1530 stent. 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 Kennith Gain at [facility] in 2010. He did have a follow up evaluation there 2 yrs later and was released from their service.. Because of complaints of an uncomfortable pressure sensation in his chest, a Myoview stress test performed January 16, 2011, showed mild to moderate septal ischemia. He was catheterized January 29, 2011, revealing high-grade proximal LAD as well as AV groove circumflex disease, both of which were stented with Resolute drug-eluting stents. His previously placed LAD stent was widely patent. He has been asymptomatic since. His other problems include hypertension and hyperlipidemia. He does travel quite a bit to [place] as well as [place] his [place] and grandchildren live.  Since I saw him a year ago he is remained stable. He denies chest pain or shortness of breath.   Current Meds  Medication Sig  . amLODipine (NORVASC) 5 MG tablet Take 5 mg by mouth daily.  Marland Kitchen aspirin 81 MG tablet Take 81 mg by mouth daily.  Marland Kitchen atorvastatin (LIPITOR) 20 MG tablet Take 20 mg by mouth daily.  . Calcium Carb-Cholecalciferol (CALCIUM 1000 + D PO) Take by mouth daily.  . Coenzyme Q10 (COQ10 PO) Take by mouth.  Marland Kitchen glucosamine-chondroitin 500-400 MG tablet Take 1 tablet by mouth 3 (three) times daily. Reported on 10/15/2015  . losartan (COZAAR) 100 MG tablet Take 100 mg by mouth daily.  . metoprolol tartrate (LOPRESSOR)  25 MG tablet Take 0.5 tablets (12.5 mg total) by mouth 2 (two) times daily.  . Multiple Vitamin (MULTIVITAMIN) tablet Take 1 tablet by mouth daily.  . Omega-3 Fatty Acids (FISH OIL) 1200 MG CAPS Take by mouth daily.     Allergies  Allergen Reactions  . Plavix [Clopidogrel Bisulfate]     Unknown reaction    Social History   Socioeconomic History  . Marital status: Married    Spouse name: Not on file  . Number of children: Not on file  . Years of education: Not on file  . Highest education level: Not on file  Occupational History  . Not on file  Tobacco Use  . Smoking status: Never Smoker  . Smokeless tobacco: Never Used  Substance and Sexual Activity  . Alcohol use: Yes    Alcohol/week: 0.0 standard drinks    Comment: socially wine  . Drug use: No  . Sexual activity: Not on file  Other Topics Concern  . Not on file  Social History Narrative  . Not on file   Social Determinants of Health   Financial Resource Strain: Not on file  Food Insecurity: Not on file  Transportation Needs: Not on file  Physical Activity: Not on file  Stress: Not on file  Social Connections: Not on file  Intimate Partner Violence: Not on file     Review of Systems: General: negative for chills, fever, night sweats or weight changes.  Cardiovascular: negative for chest pain, dyspnea  on exertion, edema, orthopnea, palpitations, paroxysmal nocturnal dyspnea or shortness of breath Dermatological: negative for rash Respiratory: negative for cough or wheezing Urologic: negative for hematuria Abdominal: negative for nausea, vomiting, diarrhea, bright red blood per rectum, melena, or hematemesis Neurologic: negative for visual changes, syncope, or dizziness All other systems reviewed and are otherwise negative except as noted above.    Blood pressure (!) 150/79, pulse 61, height 5\' 11"  (1.803 m), weight 174 lb (78.9 kg), SpO2 97 %.  General appearance: alert and no distress Neck: no adenopathy,  no carotid bruit, no JVD, supple, symmetrical, trachea midline and thyroid not enlarged, symmetric, no tenderness/mass/nodules Lungs: clear to auscultation bilaterally Heart: regular rate and rhythm, S1, S2 normal, no murmur, click, rub or gallop Extremities: extremities normal, atraumatic, no cyanosis or edema Pulses: 2+ and symmetric Skin: Skin color, texture, turgor normal. No rashes or lesions Neurologic: Alert and oriented X 3, normal strength and tone. Normal symmetric reflexes. Normal coordination and gait  EKG not performed today  ASSESSMENT AND PLAN:   CAD - H/O PCI She has CAD status post LAD stenting by Dr. Claiborne Billings 04/07/1996 with a [place] PS-1530 stent.  Because of chest discomfort a Myoview stress test performed 01/16/2011 showed mild to moderate septal ischemia.  This led to cardiac catheterization 01/29/2011 revealing high-grade proximal LAD as well as AV groove circumflex disease both of which were stented with resolute drug-eluting stents.  His peak previously placed LAD stent was widely patent.  Has been asymptomatic since that time.  HTN (hypertension) History of essential hypertension a blood pressure measured today 150/79.  I did review his blood pressure log which showed blood pressures much better than this at home which he measures on a daily basis.  He is on amlodipine, losartan and metoprolol.  Dyslipidemia History of dyslipidemia on statin therapy with lipid profile performed 05/01/2020 revealing total cholesterol 127, LDL 58 and HDL 52.      Lorretta Harp MD [facility], [facility] 08/20/2020 11:30 AM

## 2020-08-20 NOTE — Assessment & Plan Note (Signed)
She has CAD status post LAD stenting by Dr. Claiborne Billings 04/07/1996 with a [place] PS-1530 stent.  Because of chest discomfort a Myoview stress test performed 01/16/2011 showed mild to moderate septal ischemia.  This led to cardiac catheterization 01/29/2011 revealing high-grade proximal LAD as well as AV groove circumflex disease both of which were stented with resolute drug-eluting stents.  His peak previously placed LAD stent was widely patent.  Has been asymptomatic since that time.

## 2020-08-20 NOTE — Patient Instructions (Signed)

## 2020-10-22 DIAGNOSIS — Z23 Encounter for immunization: Secondary | ICD-10-CM | POA: Diagnosis not present

## 2020-11-06 DIAGNOSIS — N182 Chronic kidney disease, stage 2 (mild): Secondary | ICD-10-CM | POA: Diagnosis not present

## 2020-11-06 DIAGNOSIS — E7800 Pure hypercholesterolemia, unspecified: Secondary | ICD-10-CM | POA: Diagnosis not present

## 2020-11-06 DIAGNOSIS — R7309 Other abnormal glucose: Secondary | ICD-10-CM | POA: Diagnosis not present

## 2020-11-06 DIAGNOSIS — E785 Hyperlipidemia, unspecified: Secondary | ICD-10-CM | POA: Diagnosis not present

## 2020-11-13 DIAGNOSIS — E785 Hyperlipidemia, unspecified: Secondary | ICD-10-CM | POA: Diagnosis not present

## 2020-11-13 DIAGNOSIS — I129 Hypertensive chronic kidney disease with stage 1 through stage 4 chronic kidney disease, or unspecified chronic kidney disease: Secondary | ICD-10-CM | POA: Diagnosis not present

## 2020-11-13 DIAGNOSIS — N182 Chronic kidney disease, stage 2 (mild): Secondary | ICD-10-CM | POA: Diagnosis not present

## 2020-11-13 DIAGNOSIS — R7309 Other abnormal glucose: Secondary | ICD-10-CM | POA: Diagnosis not present

## 2021-01-22 ENCOUNTER — Emergency Department (HOSPITAL_COMMUNITY)
Admission: EM | Admit: 2021-01-22 | Discharge: 2021-01-22 | Disposition: A | Discharge status: Home / Self Care | Payer: Medicare Other | Attending: Emergency Medicine | Admitting: Emergency Medicine

## 2021-01-22 ENCOUNTER — Other Ambulatory Visit: Payer: Self-pay

## 2021-01-22 DIAGNOSIS — Z5321 Procedure and treatment not carried out due to patient leaving prior to being seen by health care provider: Secondary | ICD-10-CM | POA: Insufficient documentation

## 2021-01-22 DIAGNOSIS — R42 Dizziness and giddiness: Secondary | ICD-10-CM | POA: Insufficient documentation

## 2021-01-22 LAB — CBC WITH DIFFERENTIAL/PLATELET
Abs Immature Granulocytes: 0.01 10*3/uL (ref 0.00–0.07)
Basophils Absolute: 0 10*3/uL (ref 0.0–0.1)
Basophils Relative: 1 %
Eosinophils Absolute: 0.1 10*3/uL (ref 0.0–0.5)
Eosinophils Relative: 2 %
HCT: 45.7 % (ref 39.0–52.0)
Hemoglobin: 15 g/dL (ref 13.0–17.0)
Immature Granulocytes: 0 %
Lymphocytes Relative: 29 %
Lymphs Abs: 1.7 10*3/uL (ref 0.7–4.0)
MCH: 31.3 pg (ref 26.0–34.0)
MCHC: 32.8 g/dL (ref 30.0–36.0)
MCV: 95.4 fL (ref 80.0–100.0)
Monocytes Absolute: 0.6 10*3/uL (ref 0.1–1.0)
Monocytes Relative: 10 %
Neutro Abs: 3.5 10*3/uL (ref 1.7–7.7)
Neutrophils Relative %: 58 %
Platelets: 161 10*3/uL (ref 150–400)
RBC: 4.79 MIL/uL (ref 4.22–5.81)
RDW: 13.2 % (ref 11.5–15.5)
WBC: 5.9 10*3/uL (ref 4.0–10.5)
nRBC: 0 % (ref 0.0–0.2)

## 2021-01-22 LAB — BASIC METABOLIC PANEL
Anion gap: 8 (ref 5–15)
BUN: 17 mg/dL (ref 8–23)
CO2: 27 mmol/L (ref 22–32)
Calcium: 9 mg/dL (ref 8.9–10.3)
Chloride: 101 mmol/L (ref 98–111)
Creatinine, Ser: 1.07 mg/dL (ref 0.61–1.24)
GFR, Estimated: >60 mL/min (ref >60)
Glucose, Bld: 174 mg/dL — ABNORMAL HIGH (ref 70–99)
Potassium: 3.7 mmol/L (ref 3.5–5.1)
Sodium: 136 mmol/L (ref 135–145)

## 2021-01-22 MED ORDER — MECLIZINE HCL 25 MG PO TABS
25.0000 mg | ORAL_TABLET | Freq: Once | ORAL | Status: AC
  Administered 2021-01-22: 25 mg via ORAL
  Filled 2021-01-22: qty 1

## 2021-01-22 NOTE — ED Triage Notes (Signed)
Pt C/O dizziness and feeling like the room suddenly while watching TV today around midnight along with sweating, and vomiting. Pale in appearance in triage.

## 2021-01-22 NOTE — ED Notes (Signed)
Pt says he feels better and want to leave.

## 2021-01-22 NOTE — ED Provider Notes (Signed)
Emergency Medicine Provider Triage Evaluation Note  ARI BERNABEI , a 75 y.o. male  was evaluated in triage.  Pt complains of sudden onset room-spinning dizziness that started tonight at midnight (1.5 hrs ago).  Denies any vision changes, speech changes, numbness, weakness or tingling.  Denies treatments PTA.  Review of Systems  Positive: dizziness Negative: weakness  Physical Exam  BP (!) 156/83 (BP Location: Right Arm)   Pulse 64   Temp 98.3 F (36.8 C) (Oral)   Resp 16   Ht 5\' 11"  (1.803 m)   Wt 78.9 kg   SpO2 97%   BMI 24.27 kg/m  Gen:   Awake, no distress   Resp:  Normal effort  MSK:   Moves extremities without difficulty  Other:    Medical Decision Making  Medically screening exam initiated at 1:34 AM.  Appropriate orders placed.  Arville Postlewaite Godino was informed that the remainder of the evaluation will be completed by another provider, this initial triage assessment does not replace that evaluation, and the importance of remaining in the ED until their evaluation is complete.  Discussed with Dr. Roxanne Mins, who recommends meclizine and labs.   Montine Circle, PA-C 25/91/02 [id number]    Delora Fuel, MD 28/40/69 [phone]

## 2021-03-23 DIAGNOSIS — Z23 Encounter for immunization: Secondary | ICD-10-CM | POA: Diagnosis not present

## 2021-04-11 DIAGNOSIS — Z23 Encounter for immunization: Secondary | ICD-10-CM | POA: Diagnosis not present

## 2021-04-22 DIAGNOSIS — R319 Hematuria, unspecified: Secondary | ICD-10-CM | POA: Diagnosis not present

## 2021-05-21 DIAGNOSIS — I129 Hypertensive chronic kidney disease with stage 1 through stage 4 chronic kidney disease, or unspecified chronic kidney disease: Secondary | ICD-10-CM | POA: Diagnosis not present

## 2021-05-21 DIAGNOSIS — R7309 Other abnormal glucose: Secondary | ICD-10-CM | POA: Diagnosis not present

## 2021-05-21 DIAGNOSIS — Z0000 Encounter for general adult medical examination without abnormal findings: Secondary | ICD-10-CM | POA: Diagnosis not present

## 2021-05-21 DIAGNOSIS — R5383 Other fatigue: Secondary | ICD-10-CM | POA: Diagnosis not present

## 2021-05-21 DIAGNOSIS — N182 Chronic kidney disease, stage 2 (mild): Secondary | ICD-10-CM | POA: Diagnosis not present

## 2021-05-21 DIAGNOSIS — E785 Hyperlipidemia, unspecified: Secondary | ICD-10-CM | POA: Diagnosis not present

## 2021-05-28 DIAGNOSIS — I2510 Atherosclerotic heart disease of native coronary artery without angina pectoris: Secondary | ICD-10-CM | POA: Diagnosis not present

## 2021-05-28 DIAGNOSIS — I129 Hypertensive chronic kidney disease with stage 1 through stage 4 chronic kidney disease, or unspecified chronic kidney disease: Secondary | ICD-10-CM | POA: Diagnosis not present

## 2021-05-28 DIAGNOSIS — R801 Persistent proteinuria, unspecified: Secondary | ICD-10-CM | POA: Diagnosis not present

## 2021-05-28 DIAGNOSIS — N182 Chronic kidney disease, stage 2 (mild): Secondary | ICD-10-CM | POA: Diagnosis not present

## 2021-05-28 DIAGNOSIS — E785 Hyperlipidemia, unspecified: Secondary | ICD-10-CM | POA: Diagnosis not present

## 2021-05-28 DIAGNOSIS — R7309 Other abnormal glucose: Secondary | ICD-10-CM | POA: Diagnosis not present

## 2021-06-12 DIAGNOSIS — R801 Persistent proteinuria, unspecified: Secondary | ICD-10-CM | POA: Diagnosis not present

## 2021-06-12 DIAGNOSIS — K8020 Calculus of gallbladder without cholecystitis without obstruction: Secondary | ICD-10-CM | POA: Diagnosis not present

## 2021-06-12 DIAGNOSIS — N182 Chronic kidney disease, stage 2 (mild): Secondary | ICD-10-CM | POA: Diagnosis not present

## 2021-08-15 DIAGNOSIS — H43393 Other vitreous opacities, bilateral: Secondary | ICD-10-CM | POA: Diagnosis not present

## 2021-08-15 DIAGNOSIS — Z961 Presence of intraocular lens: Secondary | ICD-10-CM | POA: Diagnosis not present

## 2021-08-27 ENCOUNTER — Ambulatory Visit (INDEPENDENT_AMBULATORY_CARE_PROVIDER_SITE_OTHER): Payer: Medicare Other | Admitting: Cardiovascular Disease

## 2021-08-27 ENCOUNTER — Encounter: Payer: Self-pay | Admitting: Cardiovascular Disease

## 2021-08-27 ENCOUNTER — Other Ambulatory Visit: Payer: Self-pay

## 2021-08-27 VITALS — BP 148/86 | HR 56 | Ht 71.0 in | Wt 167.3 lb

## 2021-08-27 DIAGNOSIS — E785 Hyperlipidemia, unspecified: Secondary | ICD-10-CM | POA: Diagnosis not present

## 2021-08-27 DIAGNOSIS — I10 Essential (primary) hypertension: Secondary | ICD-10-CM

## 2021-08-27 DIAGNOSIS — I2510 Atherosclerotic heart disease of native coronary artery without angina pectoris: Secondary | ICD-10-CM

## 2021-08-27 NOTE — Patient Instructions (Signed)

## 2021-08-27 NOTE — Assessment & Plan Note (Signed)
History of CAD status post LAD stenting by Dr. Ellouise Newer April 07, 1996 with J and J PS-1530 stent.  He had a Myoview stress test performed 01/16/2011 that showed moderate septal ischemia.  He was catheterized 01/29/2011 revealing high-grade proximal LAD as well as AV groove circumflex disease both of which were stented using resolute drug-eluting stents.  His previously placed LAD stent was widely patent.  He denies chest pain or shortness of breath.

## 2021-08-27 NOTE — Progress Notes (Signed)
08/27/2021 Evan [name]   [birth year]  [id number]  Primary Physician Merrilee Seashore, MD Primary Cardiologist: Lorretta Harp MD [name], [place]  HPI:  Evan [name] is a 76 y.o.   thin-appearing, married Asian male, father of 2, grandfather to 1 grandchild who is retired from [employer]. I last saw him 08/20/2020. His wife was a Software engineer at [employer], and recently retired.  He has a history of CAD status post LAD stenting by Dr. Ellouise Newer April 07, 1996, with a J&J PS-1530 stent. 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 Kennith Gain at [facility] in 2010. He did have a follow up evaluation there 2 yrs later and was released from their service.. Because of complaints of an uncomfortable pressure sensation in his chest, a Myoview stress test performed January 16, 2011, showed mild to moderate septal ischemia. He was catheterized January 29, 2011, revealing high-grade proximal LAD as well as AV groove circumflex disease, both of which were stented with Resolute drug-eluting stents. His previously placed LAD stent was widely patent. He has been asymptomatic since. His other problems include hypertension and hyperlipidemia.  He does travel quite a bit to [place] as well as [place] where his son and grandchildren live.    Since I saw him a year ago he is remained stable.  He denies chest pain or shortness of breath.  He is scheduled to go to [place] with his family next month stopping off and [place] on the return trip.     Current Meds  Medication Sig   amLODipine (NORVASC) 5 MG tablet Take 5 mg by mouth daily.   aspirin 81 MG tablet Take 81 mg by mouth daily.   atorvastatin (LIPITOR) 20 MG tablet Take 20 mg by mouth daily.   Calcium Carb-Cholecalciferol (CALCIUM 1000 + D PO) Take by mouth daily.   Coenzyme Q10 (COQ10 PO) Take by mouth.   glucosamine-chondroitin 500-400 MG tablet Take 1 tablet by mouth 3 (three)  times daily. Reported on 10/15/2015   metoprolol tartrate (LOPRESSOR) 25 MG tablet Take 0.5 tablets (12.5 mg total) by mouth 2 (two) times daily.   Multiple Vitamin (MULTIVITAMIN) tablet Take 1 tablet by mouth daily.   olmesartan (BENICAR) 40 MG tablet 1 tablet   Omega-3 Fatty Acids (FISH OIL) 1200 MG CAPS Take by mouth daily.   [DISCONTINUED] losartan (COZAAR) 100 MG tablet Take 100 mg by mouth daily.     Allergies  Allergen Reactions   Plavix [Clopidogrel Bisulfate]     Unknown reaction    Social History   Socioeconomic History   Marital status: Married    Spouse name: Not on file   Number of children: Not on file   Years of education: Not on file   Highest education level: Not on file  Occupational History   Not on file  Tobacco Use   Smoking status: Never   Smokeless tobacco: Never  Substance and Sexual Activity   Alcohol use: Yes    Alcohol/week: 0.0 standard drinks    Comment: socially wine   Drug use: No   Sexual activity: Not on file  Other Topics Concern   Not on file  Social History Narrative   Not on file   Social Determinants of Health   Financial Resource Strain: Not on file  Food Insecurity: Not on file  Transportation Needs: Not on file  Physical Activity: Not on file  Stress: Not on file  Social Connections: Not on file  Intimate Partner Violence: Not on file     Review of Systems: General: negative for chills, fever, night sweats or weight changes.  Cardiovascular: negative for chest pain, dyspnea on exertion, edema, orthopnea, palpitations, paroxysmal nocturnal dyspnea or shortness of breath Dermatological: negative for rash Respiratory: negative for cough or wheezing Urologic: negative for hematuria Abdominal: negative for nausea, vomiting, diarrhea, bright red blood per rectum, melena, or hematemesis Neurologic: negative for visual changes, syncope, or dizziness All other systems reviewed and are otherwise negative except as noted  above.    Blood pressure (!) 148/86, pulse (!) 56, height 5\' 11"  (1.803 m), weight 167 lb 4.8 oz (75.9 kg), SpO2 100 %.  General appearance: alert and no distress Neck: no adenopathy, no carotid bruit, no JVD, supple, symmetrical, trachea midline, and thyroid not enlarged, symmetric, no tenderness/mass/nodules Lungs: clear to auscultation bilaterally Heart: regular rate and rhythm, S1, S2 normal, no murmur, click, rub or gallop Extremities: extremities normal, atraumatic, no cyanosis or edema Pulses: 2+ and symmetric Skin: Skin color, texture, turgor normal. No rashes or lesions Neurologic: Grossly normal  EKG sinus bradycardia at 56 without ST or T wave changes.  I personally reviewed this EKG.  ASSESSMENT AND PLAN:   CAD - H/O PCI History of CAD status post LAD stenting by Dr. Ellouise Newer April 07, 1996 with J and J PS-1530 stent.  He had a Myoview stress test performed 01/16/2011 that showed moderate septal ischemia.  He was catheterized 01/29/2011 revealing high-grade proximal LAD as well as AV groove circumflex disease both of which were stented using resolute drug-eluting stents.  His previously placed LAD stent was widely patent.  He denies chest pain or shortness of breath.  HTN (hypertension) History of essential hypertension a blood pressure measured today at 148/86.  He is on amlodipine, metoprolol and Benicar.  Dyslipidemia History of hyperlipidemia on statin therapy with lipid profile performed 05/21/2021 revealing total cholesterol 148, LDL of 76 and HDL 51.     Lorretta Harp MD FACP,FACC,FAHA, [facility] 08/27/2021 10:54 AM

## 2021-08-27 NOTE — Assessment & Plan Note (Signed)
History of hyperlipidemia on statin therapy with lipid profile performed 05/21/2021 revealing total cholesterol 148, LDL of 76 and HDL 51.

## 2021-08-27 NOTE — Assessment & Plan Note (Signed)
History of essential hypertension a blood pressure measured today at 148/86.  He is on amlodipine, metoprolol and Benicar.

## 2021-09-05 DIAGNOSIS — M76891 Other specified enthesopathies of right lower limb, excluding foot: Secondary | ICD-10-CM | POA: Diagnosis not present

## 2021-09-05 DIAGNOSIS — M1611 Unilateral primary osteoarthritis, right hip: Secondary | ICD-10-CM | POA: Diagnosis not present

## 2021-09-05 DIAGNOSIS — R1031 Right lower quadrant pain: Secondary | ICD-10-CM | POA: Diagnosis not present

## 2021-09-10 DIAGNOSIS — I2510 Atherosclerotic heart disease of native coronary artery without angina pectoris: Secondary | ICD-10-CM | POA: Diagnosis not present

## 2021-09-10 DIAGNOSIS — N182 Chronic kidney disease, stage 2 (mild): Secondary | ICD-10-CM | POA: Diagnosis not present

## 2021-09-10 DIAGNOSIS — I129 Hypertensive chronic kidney disease with stage 1 through stage 4 chronic kidney disease, or unspecified chronic kidney disease: Secondary | ICD-10-CM | POA: Diagnosis not present

## 2021-09-10 DIAGNOSIS — R7309 Other abnormal glucose: Secondary | ICD-10-CM | POA: Diagnosis not present

## 2021-09-10 DIAGNOSIS — E785 Hyperlipidemia, unspecified: Secondary | ICD-10-CM | POA: Diagnosis not present

## 2021-09-17 DIAGNOSIS — M7071 Other bursitis of hip, right hip: Secondary | ICD-10-CM | POA: Diagnosis not present

## 2021-09-17 DIAGNOSIS — N182 Chronic kidney disease, stage 2 (mild): Secondary | ICD-10-CM | POA: Diagnosis not present

## 2021-09-17 DIAGNOSIS — I10 Essential (primary) hypertension: Secondary | ICD-10-CM | POA: Diagnosis not present

## 2021-09-17 DIAGNOSIS — R801 Persistent proteinuria, unspecified: Secondary | ICD-10-CM | POA: Diagnosis not present

## 2021-09-17 DIAGNOSIS — E785 Hyperlipidemia, unspecified: Secondary | ICD-10-CM | POA: Diagnosis not present

## 2021-09-17 DIAGNOSIS — R7309 Other abnormal glucose: Secondary | ICD-10-CM | POA: Diagnosis not present

## 2021-10-09 DIAGNOSIS — M7071 Other bursitis of hip, right hip: Secondary | ICD-10-CM | POA: Diagnosis not present

## 2021-10-09 DIAGNOSIS — M25559 Pain in unspecified hip: Secondary | ICD-10-CM | POA: Diagnosis not present

## 2021-10-13 DIAGNOSIS — M25551 Pain in right hip: Secondary | ICD-10-CM | POA: Diagnosis not present

## 2021-10-15 DIAGNOSIS — M25551 Pain in right hip: Secondary | ICD-10-CM | POA: Diagnosis not present

## 2021-11-04 DIAGNOSIS — M25551 Pain in right hip: Secondary | ICD-10-CM | POA: Diagnosis not present

## 2021-11-15 DIAGNOSIS — U071 COVID-19: Secondary | ICD-10-CM | POA: Diagnosis not present

## 2021-12-04 ENCOUNTER — Telehealth: Payer: Self-pay | Admitting: *Deleted

## 2021-12-04 DIAGNOSIS — M25551 Pain in right hip: Secondary | ICD-10-CM | POA: Diagnosis not present

## 2021-12-04 NOTE — Telephone Encounter (Signed)
   Pre-operative Risk Assessment    Patient Name: Evan [name]  DOB: 03/30/1946 MRN: [id number]      Request for Surgical Clearance    Procedure:   RIGHT TOTAL HIP REPLACEMENT  Date of Surgery:  Clearance TBD                                 Surgeon:  DR. DANIEL MARCHWIANY Surgeon's Group or Practice Name:  [facility] Phone number:  [phone] EXT [address] Fax number:  [phone]   Type of Clearance Requested:   - Medical ; ASA    Type of Anesthesia:  Spinal   Additional requests/questions:    Jiles Prows   12/04/2021, 6:01 PM

## 2021-12-05 ENCOUNTER — Telehealth: Payer: Self-pay | Admitting: *Deleted

## 2021-12-05 NOTE — Telephone Encounter (Signed)
OK to hold ASA for 5-7 days

## 2021-12-05 NOTE — Telephone Encounter (Signed)
Evan [name] 76 year old male is requesting preoperative cardiac evaluation for right total hip replacement.  He was last seen in clinic on 08/27/2021.  He continued to do well at that time.  He denied chest pain and shortness of breath.  His PMH includes coronary artery disease, hypertension, and dyslipidemia.  He underwent cardiac catheterization 7/12 and received PCI with DES to his proximal LAD and circumflex.  His previous LAD stent was widely patent.   May his aspirin be held prior to his procedure?  Thank you for your help.  Please direct your response to CV DIV preop pool.  Jossie Ng. Tyaisha Cullom NP-C    12/05/2021, 9:08 AM [place] [address] Office [phone] Fax [phone]

## 2021-12-05 NOTE — Telephone Encounter (Signed)
Patient's aspirin hold has been addressed.  Preoperative team, please contact this patient and set up a phone call appointment for further cardiac evaluation.  Thank you for your help.  Jossie Ng. Faige Seely NP-C    12/05/2021, 1:19 PM [facility] [place] [address] Office [phone] Fax [phone]

## 2021-12-05 NOTE — Telephone Encounter (Signed)
Pt agreeable to plan of care for tele pre op appt 12/11/21 @ 2 pm. Med rec and consent are done.     Patient Consent for Virtual Visit        Evan [name] has provided verbal consent on 12/05/2021 for a virtual visit (video or telephone).   CONSENT FOR VIRTUAL VISIT FOR:  Evan [name]  By participating in this virtual visit I agree to the following:  I hereby voluntarily request, consent and authorize [facility] and its employed or contracted physicians, physician assistants, nurse practitioners or other licensed health care professionals (the Practitioner), to provide me with telemedicine health care services (the "Services") as deemed necessary by the treating Practitioner. I acknowledge and consent to receive the Services by the Practitioner via telemedicine. I understand that the telemedicine visit will involve communicating with the Practitioner through live audiovisual communication technology and the disclosure of certain medical information by electronic transmission. I acknowledge that I have been given the opportunity to request an in-person assessment or other available alternative prior to the telemedicine visit and am voluntarily participating in the telemedicine visit.  I understand that I have the right to withhold or withdraw my consent to the use of telemedicine in the course of my care at any time, without affecting my right to future care or treatment, and that the Practitioner or I may terminate the telemedicine visit at any time. I understand that I have the right to inspect all information obtained and/or recorded in the course of the telemedicine visit and may receive copies of available information for a reasonable fee.  I understand that some of the potential risks of receiving the Services via telemedicine include:  Delay or interruption in medical evaluation due to technological equipment failure or disruption; Information transmitted may not be sufficient (e.g. poor  resolution of images) to allow for appropriate medical decision making by the Practitioner; and/or  In rare instances, security protocols could fail, causing a breach of personal health information.  Furthermore, I acknowledge that it is my responsibility to provide information about my medical history, conditions and care that is complete and accurate to the best of my ability. I acknowledge that Practitioner's advice, recommendations, and/or decision may be based on factors not within their control, such as incomplete or inaccurate data provided by me or distortions of diagnostic images or specimens that may result from electronic transmissions. I understand that the practice of medicine is not an exact science and that Practitioner makes no warranties or guarantees regarding treatment outcomes. I acknowledge that a copy of this consent can be made available to me via my patient portal ([facility]), or I can request a printed copy by calling the office of [facility].    I understand that my insurance will be billed for this visit.   I have read or had this consent read to me. I understand the contents of this consent, which adequately explains the benefits and risks of the Services being provided via telemedicine.  I have been provided ample opportunity to ask questions regarding this consent and the Services and have had my questions answered to my satisfaction. I give my informed consent for the services to be provided through the use of telemedicine in my medical care

## 2021-12-05 NOTE — Telephone Encounter (Signed)
Pt agreeable to plan of care for tele pre op appt 12/11/21 @ 2 pm. Med rec and consent are done.

## 2021-12-11 ENCOUNTER — Ambulatory Visit (INDEPENDENT_AMBULATORY_CARE_PROVIDER_SITE_OTHER): Payer: Medicare Other | Admitting: Physician Assistant

## 2021-12-11 DIAGNOSIS — Z01810 Encounter for preprocedural cardiovascular examination: Secondary | ICD-10-CM | POA: Diagnosis not present

## 2021-12-11 NOTE — Progress Notes (Signed)
Virtual Visit via Telephone Note   Because of Evan [name]'s co-morbid illnesses, he is at least at moderate risk for complications without adequate follow up.  This format is felt to be most appropriate for this patient at this time.  The patient did not have access to video technology/had technical difficulties with video requiring transitioning to audio format only (telephone).  All issues noted in this document were discussed and addressed.  No physical exam could be performed with this format.  Please refer to the patient's chart for his consent to telehealth for [facility].  Evaluation Performed:  Preoperative cardiovascular risk assessment _____________   Date:  12/11/2021   Patient ID:  Evan [name], DOB [birth year], MRN [id number] Patient Location:  Home Provider location:   Office  Primary Care Provider:  Merrilee Seashore, MD Primary Cardiologist:  None  Chief Complaint / Patient Profile   76 y.o. y/o male with a h/o CAD, renal artery aneurysm s/p surgical resection at [facility] in 2010, hypertension, hyperlipidemia, obstructive sleep apnea who is pending right total hip replacement and presents today for telephonic preoperative cardiovascular risk assessment.   Past Medical History    Past Medical History:  Diagnosis Date   Aneurysm artery, renal ([place]) 2010   Surgery 2010 at [facility]   Coronary artery disease sept 1997   LAD PCI '97, LAD/CFX DES 7/12   Hyperlipidemia    PCP follows   Hypertension    OSA on CPAP 07/2008   does not have CPAP   Sleep apnea    Does not have CPAP   Vertigo    Past Surgical History:  Procedure Laterality Date   COLONOSCOPY     [facility]    CORONARY STENT PLACEMENT  04/06/96 & 01/29/11   LAD PCI '97, LAD/CFX DES 7/12   DOPPLER ECHOCARDIOGRAPHY  03/09/2008   EF 50-55%   NM MYOCAR PERF WALL MOTION  03/09/2008   subtle septal ischemia which decide to treat mediclly   NM MYOCAR PERF WALL MOTION  01/16/2011   showed mild to  moderate septal ischemia   renal arterial aneursym  05/21/09   surgerical resectd by Dr Kennith Gain at [place] [facility]   Plavix [Clopidogrel Bisulfate]     Unknown reaction    History of Present Illness    Evan [name] is a 76 y.o. male who presents via audio/video conferencing for a telehealth visit today.  Pt was last seen in cardiology clinic on 08/27/2021 by Dr. Gwenlyn Found.  At that time Evan [name] was doing well.  The patient is now pending procedure as outlined above. Since his last visit, he has been doing well without chest pain or worsening shortness of breath.  He is traveling to [place] and [place] in March and was able to ambulate for long distance without exertional symptoms.  Current functional ability is limited by right hip pain.   Home Medications    Prior to Admission medications   Medication Sig Start Date End Date Taking? Authorizing Provider  amLODipine (NORVASC) 5 MG tablet Take 5 mg by mouth daily.    [provider]  aspirin 81 MG tablet Take 81 mg by mouth daily.    [provider]  atorvastatin (LIPITOR) 20 MG tablet Take 20 mg by mouth daily.    [provider]  Calcium Carb-Cholecalciferol (CALCIUM 1000 + D PO) Take by mouth daily.    [provider]  Coenzyme  Q10 (COQ10 PO) Take by mouth.    [provider]  glucosamine-chondroitin 500-400 MG tablet Take 1 tablet by mouth 3 (three) times daily. Reported on 10/15/2015    [provider]  metoprolol tartrate (LOPRESSOR) 25 MG tablet Take 0.5 tablets (12.5 mg total) by mouth 2 (two) times daily. 06/11/17   Lorretta Harp, MD  Multiple Vitamin (MULTIVITAMIN) tablet Take 1 tablet by mouth daily.    [provider]  olmesartan (BENICAR) 40 MG tablet 1 tablet 05/29/21   [provider]  Omega-3 Fatty Acids (FISH OIL) 1200 MG CAPS Take by mouth daily.    [provider]    Physical Exam     Vital Signs:  Evan [name] does not have vital signs available for review today.  Given telephonic nature of communication, physical exam is limited. AAOx3. NAD. Normal affect.  Speech and respirations are unlabored.  Accessory Clinical Findings    None  Assessment & Plan    1.  Preoperative Cardiovascular Risk Assessment:  -Patient has upcoming right total hip replacement surgery.  He denies any chest pain or worsening shortness of breath.  Last PCI was in 2012.  He recently traveled to [place] and [place] and was able to ambulate long distance oversee without any exertional symptoms or chest pain.  He is cleared to proceed with the upcoming surgery without further work-up.  He may hold aspirin and the fish oil for 7 days prior to the procedure and restart as soon as possible afterward at the surgeon's discretion.  A copy of this note will be routed to requesting surgeon.  Time:   Today, I have spent 11 minutes with the patient with telehealth technology discussing medical history, symptoms, and management plan.     [place], [place]  12/11/2021, 1:56 PM

## 2021-12-15 DIAGNOSIS — Z01818 Encounter for other preprocedural examination: Secondary | ICD-10-CM | POA: Diagnosis not present

## 2021-12-15 DIAGNOSIS — R7309 Other abnormal glucose: Secondary | ICD-10-CM | POA: Diagnosis not present

## 2021-12-22 DIAGNOSIS — R801 Persistent proteinuria, unspecified: Secondary | ICD-10-CM | POA: Diagnosis not present

## 2021-12-22 DIAGNOSIS — Z01818 Encounter for other preprocedural examination: Secondary | ICD-10-CM | POA: Diagnosis not present

## 2021-12-22 DIAGNOSIS — E785 Hyperlipidemia, unspecified: Secondary | ICD-10-CM | POA: Diagnosis not present

## 2021-12-22 DIAGNOSIS — I10 Essential (primary) hypertension: Secondary | ICD-10-CM | POA: Diagnosis not present

## 2021-12-22 DIAGNOSIS — R7309 Other abnormal glucose: Secondary | ICD-10-CM | POA: Diagnosis not present

## 2021-12-22 DIAGNOSIS — N182 Chronic kidney disease, stage 2 (mild): Secondary | ICD-10-CM | POA: Diagnosis not present

## 2022-02-02 DIAGNOSIS — M25551 Pain in right hip: Secondary | ICD-10-CM | POA: Diagnosis not present

## 2022-02-04 ENCOUNTER — Ambulatory Visit: Payer: Self-pay | Admitting: Physician Assistant

## 2022-02-04 DIAGNOSIS — G8929 Other chronic pain: Secondary | ICD-10-CM

## 2022-02-04 NOTE — Care Plan (Signed)
Ortho Bundle Case Management Note  Patient Details  Name: Evan [name] MRN: [id number] Date of Birth: 16-Mar-1946   Met with patient in the office prior to surgery. He will discharge to home with family to assist. Has equipment at home. OPPT set up with [place]. Patient and MD in agreement with plan. Choice offered                   DME Arranged:    DME Agency:     HH Arranged:    HH Agency:     Additional Comments: Please contact me with any questions of if this plan should need to change.  Ladell Heads,  [place] Specialist  [phone] 02/04/2022, 3:13 PM

## 2022-02-04 NOTE — H&P (Signed)
TOTAL HIP ADMISSION H&P  Patient is admitted for right total hip arthroplasty.  Subjective:  Chief Complaint: right hip pain  HPI: Evan [name], 76 y.o. male, has a history of pain and functional disability in the right hip(s) due to arthritis and patient has failed non-surgical conservative treatments for greater than 12 weeks to include NSAID's and/or analgesics, corticosteriod injections, flexibility and strengthening excercises, supervised PT with diminished ADL's post treatment, use of assistive devices, weight reduction as appropriate, and activity modification.  Onset of symptoms was gradual starting 5 years ago with gradually worsening course since that time.The patient noted no past surgery on the right hip(s).  Patient currently rates pain in the right hip at 8 out of 10 with activity. Patient has night pain, worsening of pain with activity and weight bearing, trendelenberg gait, pain that interfers with activities of daily living, and pain with passive range of motion. Patient has evidence of periarticular osteophytes and joint space narrowing by imaging studies. This condition presents safety issues increasing the risk of falls.  There is no current active infection.  Patient Active Problem List   Diagnosis Date Noted   Slow heart rate 06/11/2017   CAD - H/O PCI 03/30/2013   PVD  03/30/2013   HTN (hypertension) 03/30/2013   Dyslipidemia 03/30/2013   Past Medical History:  Diagnosis Date   Aneurysm artery, renal ([place]) 2010   Surgery 2010 at [facility]   Coronary artery disease sept 1997   LAD PCI '97, LAD/CFX DES 7/12   Hyperlipidemia    PCP follows   Hypertension    OSA on CPAP 07/2008   does not have CPAP   Sleep apnea    Does not have CPAP   Vertigo     Past Surgical History:  Procedure Laterality Date   COLONOSCOPY     [facility]    CORONARY STENT PLACEMENT  04/06/96 & 01/29/11   LAD PCI '97, LAD/CFX DES 7/12   DOPPLER ECHOCARDIOGRAPHY  03/09/2008   EF 50-55%   NM  MYOCAR PERF WALL MOTION  03/09/2008   subtle septal ischemia which decide to treat mediclly   NM MYOCAR PERF WALL MOTION  01/16/2011   showed mild to moderate septal ischemia   renal arterial aneursym  05/21/09   surgerical resectd by Dr Kennith Gain at [facility]    Current Outpatient Medications  Medication Sig Dispense Refill Last Dose   amLODipine (NORVASC) 5 MG tablet Take 5 mg by mouth daily.      aspirin 81 MG tablet Take 81 mg by mouth daily.      atorvastatin (LIPITOR) 20 MG tablet Take 20 mg by mouth daily.      Calcium Carb-Cholecalciferol (CALCIUM 1000 + D PO) Take by mouth daily.      Coenzyme Q10 (COQ10 PO) Take by mouth.      glucosamine-chondroitin 500-400 MG tablet Take 1 tablet by mouth 3 (three) times daily. Reported on 10/15/2015      metoprolol tartrate (LOPRESSOR) 25 MG tablet Take 0.5 tablets (12.5 mg total) by mouth 2 (two) times daily. 30 tablet 6    Multiple Vitamin (MULTIVITAMIN) tablet Take 1 tablet by mouth daily.      olmesartan (BENICAR) 40 MG tablet 1 tablet      Omega-3 Fatty Acids (FISH OIL) 1200 MG CAPS Take by mouth daily.      No current facility-administered medications for this visit.   Allergies  Allergen Reactions   Plavix [Clopidogrel Bisulfate]  Unknown reaction    Social History   Tobacco Use   Smoking status: Never   Smokeless tobacco: Never  Substance Use Topics   Alcohol use: Yes    Alcohol/week: 0.0 standard drinks of alcohol    Comment: socially wine    Family History  Problem Relation Age of Onset   Hypertension Mother    Colon polyps Neg Hx    Rectal cancer Neg Hx    Stomach cancer Neg Hx    Esophageal cancer Neg Hx      Review of Systems  Musculoskeletal:  Positive for arthralgias.  All other systems reviewed and are negative.   Objective:  Physical Exam Constitutional:      General: He is not in acute distress.    Appearance: Normal appearance.  HENT:     Head: Normocephalic and atraumatic.  Eyes:      Extraocular Movements: Extraocular movements intact.     Pupils: Pupils are equal, round, and reactive to light.  Cardiovascular:     Rate and Rhythm: Normal rate and regular rhythm.     Pulses: Normal pulses.     Heart sounds: Normal heart sounds. No murmur heard. Pulmonary:     Effort: Pulmonary effort is normal. No respiratory distress.     Breath sounds: Normal breath sounds. No wheezing.  Abdominal:     General: Abdomen is flat. Bowel sounds are normal. There is no distension.     Palpations: Abdomen is soft.     Tenderness: There is no abdominal tenderness.  Musculoskeletal:     Cervical back: Normal range of motion and neck supple.     Comments: On examination he has pain with hip flexion and internal rotation. He has flexion to 90 with internal rotation to 5, external rotation to 2. Intact hip flexion, knee extension, ankle dorsiflexion and plantarflexion. Full painless right knee range of motion  Skin:    General: Skin is warm and dry.  Neurological:     General: No focal deficit present.     Mental Status: He is alert and oriented to person, place, and time.  Psychiatric:        Mood and Affect: Mood normal.        Behavior: Behavior normal.     Vital signs in last 24 hours: '@VSRANGES'$ @  Labs:   Estimated body mass index is 23.33 kg/m as calculated from the following:   Height as of 08/27/21: '5\' 11"'$  (1.803 m).   Weight as of 08/27/21: 75.9 kg.   Imaging Review Plain radiographs demonstrate moderate degenerative joint disease of the right hip(s). The bone quality appears to be good for age and reported activity level.      Assessment/Plan:  End stage arthritis, right hip(s)  The patient history, physical examination, clinical judgement of the provider and imaging studies are consistent with end stage degenerative joint disease of the right hip(s) and total hip arthroplasty is deemed medically necessary. The treatment options including medical management,  injection therapy, arthroscopy and arthroplasty were discussed at length. The risks and benefits of total hip arthroplasty were presented and reviewed. The risks due to aseptic loosening, infection, stiffness, dislocation/subluxation,  thromboembolic complications and other imponderables were discussed.  The patient acknowledged the explanation, agreed to proceed with the plan and consent was signed. Patient is being admitted for inpatient treatment for surgery, pain control, PT, OT, prophylactic antibiotics, VTE prophylaxis, progressive ambulation and ADL's and discharge planning.The patient is planning to be discharged  home with outpt  PT   Anticipated LOS equal to or greater than 2 midnights due to - Age 31 and older with one or more of the following:  - Obesity  - Expected need for hospital services (PT, OT, Nursing) required for safe  discharge  - Anticipated need for postoperative skilled nursing care or inpatient rehab  - Active co-morbidities: Coronary Artery Disease OR   - Unanticipated findings during/Post Surgery: None  - Patient is a high risk of re-admission due to: None

## 2022-02-04 NOTE — H&P (View-Only) (Signed)
TOTAL HIP ADMISSION H&P  Patient is admitted for right total hip arthroplasty.  Subjective:  Chief Complaint: right hip pain  HPI: Evan [name], 76 y.o. male, has a history of pain and functional disability in the right hip(s) due to arthritis and patient has failed non-surgical conservative treatments for greater than 12 weeks to include NSAID's and/or analgesics, corticosteriod injections, flexibility and strengthening excercises, supervised PT with diminished ADL's post treatment, use of assistive devices, weight reduction as appropriate, and activity modification.  Onset of symptoms was gradual starting 5 years ago with gradually worsening course since that time.The patient noted no past surgery on the right hip(s).  Patient currently rates pain in the right hip at 8 out of 10 with activity. Patient has night pain, worsening of pain with activity and weight bearing, trendelenberg gait, pain that interfers with activities of daily living, and pain with passive range of motion. Patient has evidence of periarticular osteophytes and joint space narrowing by imaging studies. This condition presents safety issues increasing the risk of falls.  There is no current active infection.  Patient Active Problem List   Diagnosis Date Noted   Slow heart rate 06/11/2017   CAD - H/O PCI 03/30/2013   PVD  03/30/2013   HTN (hypertension) 03/30/2013   Dyslipidemia 03/30/2013   Past Medical History:  Diagnosis Date   Aneurysm artery, renal ([place]) 2010   Surgery 2010 at [facility]   Coronary artery disease sept 1997   LAD PCI '97, LAD/CFX DES 7/12   Hyperlipidemia    PCP follows   Hypertension    OSA on CPAP 07/2008   does not have CPAP   Sleep apnea    Does not have CPAP   Vertigo     Past Surgical History:  Procedure Laterality Date   COLONOSCOPY     [facility]    CORONARY STENT PLACEMENT  04/06/96 & 01/29/11   LAD PCI '97, LAD/CFX DES 7/12   DOPPLER ECHOCARDIOGRAPHY  03/09/2008   EF 50-55%   NM  MYOCAR PERF WALL MOTION  03/09/2008   subtle septal ischemia which decide to treat mediclly   NM MYOCAR PERF WALL MOTION  01/16/2011   showed mild to moderate septal ischemia   renal arterial aneursym  05/21/09   surgerical resectd by Dr Kennith Gain at [facility]    Current Outpatient Medications  Medication Sig Dispense Refill Last Dose   amLODipine (NORVASC) 5 MG tablet Take 5 mg by mouth daily.      aspirin 81 MG tablet Take 81 mg by mouth daily.      atorvastatin (LIPITOR) 20 MG tablet Take 20 mg by mouth daily.      Calcium Carb-Cholecalciferol (CALCIUM 1000 + D PO) Take by mouth daily.      Coenzyme Q10 (COQ10 PO) Take by mouth.      glucosamine-chondroitin 500-400 MG tablet Take 1 tablet by mouth 3 (three) times daily. Reported on 10/15/2015      metoprolol tartrate (LOPRESSOR) 25 MG tablet Take 0.5 tablets (12.5 mg total) by mouth 2 (two) times daily. 30 tablet 6    Multiple Vitamin (MULTIVITAMIN) tablet Take 1 tablet by mouth daily.      olmesartan (BENICAR) 40 MG tablet 1 tablet      Omega-3 Fatty Acids (FISH OIL) 1200 MG CAPS Take by mouth daily.      No current facility-administered medications for this visit.   Allergies  Allergen Reactions   Plavix [Clopidogrel Bisulfate]  Unknown reaction    Social History   Tobacco Use   Smoking status: Never   Smokeless tobacco: Never  Substance Use Topics   Alcohol use: Yes    Alcohol/week: 0.0 standard drinks of alcohol    Comment: socially wine    Family History  Problem Relation Age of Onset   Hypertension Mother    Colon polyps Neg Hx    Rectal cancer Neg Hx    Stomach cancer Neg Hx    Esophageal cancer Neg Hx      Review of Systems  Musculoskeletal:  Positive for arthralgias.  All other systems reviewed and are negative.   Objective:  Physical Exam Constitutional:      General: He is not in acute distress.    Appearance: Normal appearance.  HENT:     Head: Normocephalic and atraumatic.  Eyes:      Extraocular Movements: Extraocular movements intact.     Pupils: Pupils are equal, round, and reactive to light.  Cardiovascular:     Rate and Rhythm: Normal rate and regular rhythm.     Pulses: Normal pulses.     Heart sounds: Normal heart sounds. No murmur heard. Pulmonary:     Effort: Pulmonary effort is normal. No respiratory distress.     Breath sounds: Normal breath sounds. No wheezing.  Abdominal:     General: Abdomen is flat. Bowel sounds are normal. There is no distension.     Palpations: Abdomen is soft.     Tenderness: There is no abdominal tenderness.  Musculoskeletal:     Cervical back: Normal range of motion and neck supple.     Comments: On examination he has pain with hip flexion and internal rotation. He has flexion to 90 with internal rotation to 5, external rotation to 2. Intact hip flexion, knee extension, ankle dorsiflexion and plantarflexion. Full painless right knee range of motion  Skin:    General: Skin is warm and dry.  Neurological:     General: No focal deficit present.     Mental Status: He is alert and oriented to person, place, and time.  Psychiatric:        Mood and Affect: Mood normal.        Behavior: Behavior normal.     Vital signs in last 24 hours: '@VSRANGES'$ @  Labs:   Estimated body mass index is 23.33 kg/m as calculated from the following:   Height as of 08/27/21: '5\' 11"'$  (1.803 m).   Weight as of 08/27/21: 75.9 kg.   Imaging Review Plain radiographs demonstrate moderate degenerative joint disease of the right hip(s). The bone quality appears to be good for age and reported activity level.      Assessment/Plan:  End stage arthritis, right hip(s)  The patient history, physical examination, clinical judgement of the provider and imaging studies are consistent with end stage degenerative joint disease of the right hip(s) and total hip arthroplasty is deemed medically necessary. The treatment options including medical management,  injection therapy, arthroscopy and arthroplasty were discussed at length. The risks and benefits of total hip arthroplasty were presented and reviewed. The risks due to aseptic loosening, infection, stiffness, dislocation/subluxation,  thromboembolic complications and other imponderables were discussed.  The patient acknowledged the explanation, agreed to proceed with the plan and consent was signed. Patient is being admitted for inpatient treatment for surgery, pain control, PT, OT, prophylactic antibiotics, VTE prophylaxis, progressive ambulation and ADL's and discharge planning.The patient is planning to be discharged  home with outpt  PT   Anticipated LOS equal to or greater than 2 midnights due to - Age 42 and older with one or more of the following:  - Obesity  - Expected need for hospital services (PT, OT, Nursing) required for safe  discharge  - Anticipated need for postoperative skilled nursing care or inpatient rehab  - Active co-morbidities: Coronary Artery Disease OR   - Unanticipated findings during/Post Surgery: None  - Patient is a high risk of re-admission due to: None

## 2022-02-11 NOTE — Patient Instructions (Signed)
DUE TO SPACE LIMITATIONS, ONLY TWO VISITORS  (aged 76 and older) ARE ALLOWED TO COME WITH YOU AND STAY IN THE WAITING ROOM DURING YOUR PRE OP AND PROCEDURE.   **NO VISITORS ARE ALLOWED IN THE SHORT STAY AREA OR RECOVERY ROOM!!**  IF YOU WILL BE ADMITTED INTO THE HOSPITAL YOU ARE ALLOWED ONLY FOUR SUPPORT PEOPLE DURING VISITATION HOURS (7 AM -8PM)   The support person(s) must pass our screening, and use Hand sanitizing gel. Visitors GUEST BADGE MUST BE WORN VISIBLY  One adult visitor may remain with you overnight and MUST be in the room by 8 P.M.   You are not required to quarantine at this time prior to your surgery. However, you must do this: Hand Hygiene often Do NOT share personal items Notify your provider if you are in close contact with someone who has COVID or you develop fever 100.4 or greater, new onset of sneezing, cough, sore throat, shortness of breath or body aches.       Your procedure is scheduled on:  Monday February 23, 2022  Report to [facility] Main Entrance.  Report to admitting at:   05:15  AM  +++++Call this number if you have any questions or problems the morning of surgery [phone]  Do not eat food :After Midnight the night prior to your surgery/procedure.  After Midnight you may have the following liquids until  04:15  AM DAY OF SURGERY  Clear Liquid Diet Water Black Coffee (sugar ok, NO MILK/CREAM OR CREAMERS)  Tea (sugar ok, NO MILK/CREAM OR CREAMERS) regular and decaf                             Plain Jell-O (NO RED)                                           Fruit ices (not with fruit pulp, NO RED)                                     Popsicles (NO RED)                                                                  Juice: apple, WHITE grape, WHITE cranberry Sports drinks like Gatorade (NO RED)                    The day of surgery:  Drink ONE (1) Pre-Surgery  G2 at   04:15  AM the morning of surgery. Drink in one sitting. Do not sip.   This drink was given to you during your hospital pre-op appointment visit. Nothing else to drink after completing the Pre-Surgery G2.    FOLLOW  ANY ADDITIONAL PRE OP INSTRUCTIONS YOU RECEIVED FROM YOUR SURGEON'S OFFICE!!!   Oral Hygiene is also important to reduce your risk of infection.        Remember - BRUSH YOUR TEETH THE MORNING OF SURGERY WITH YOUR REGULAR TOOTHPASTE  Take ONLY these medicines the morning of surgery with A SIP  OF WATER: Metoprolol, Amlodipine                   You may not have any metal on your body including jewelry, and body piercing  Do not wear lotions, powders, cologne, or deodorant  Men may shave face and neck.  You may bring a small overnight bag with you on the day of surgery, only pack items that are not valuable .[facility] IS NOT RESPONSIBLE   FOR VALUABLES THAT ARE LOST OR STOLEN.   DO NOT [place]. PHARMACY WILL DISPENSE MEDICATIONS LISTED ON YOUR MEDICATION LIST TO YOU DURING YOUR ADMISSION [place]!   Special Instructions: Bring a copy of your healthcare power of attorney and living will documents the day of surgery, if you wish to have them scanned into your [facility] Medical Records- EPIC  Please read over the following fact sheets you were given: IF YOU HAVE QUESTIONS ABOUT YOUR PRE-OP INSTRUCTIONS, PLEASE CALL [phone]  ([place])   [facility] - Preparing for Surgery Before surgery, you can play an important role.  Because skin is not sterile, your skin needs to be as free of germs as possible.  You can reduce the number of germs on your skin by washing with CHG (chlorahexidine gluconate) soap before surgery.  CHG is an antiseptic cleaner which kills germs and bonds with the skin to continue killing germs even after washing. Please DO NOT use if you have an allergy to CHG or antibacterial soaps.  If your skin becomes reddened/irritated stop using the CHG and inform your nurse when you arrive at Short  Stay. Do not shave (including legs and underarms) for at least 48 hours prior to the first CHG shower.  You may shave your face/neck.  Please follow these instructions carefully:  1.  Shower with CHG Soap the night before surgery and the  morning of surgery.  2.  If you choose to wash your hair, wash your hair first as usual with your normal  shampoo.  3.  After you shampoo, rinse your hair and body thoroughly to remove the shampoo.                             4.  Use CHG as you would any other liquid soap.  You can apply chg directly to the skin and wash.  Gently with a scrungie or clean washcloth.  5.  Apply the CHG Soap to your body ONLY FROM THE NECK DOWN.   Do not use on face/ open                           Wound or open sores. Avoid contact with eyes, ears mouth and genitals (private parts).                       Wash face,  Genitals (private parts) with your normal soap.             6.  Wash thoroughly, paying special attention to the area where your  surgery  will be performed.  7.  Thoroughly rinse your body with warm water from the neck down.  8.  DO NOT shower/wash with your normal soap after using and rinsing off the CHG Soap.            9.  Pat yourself dry with a  clean towel.            10.  Wear clean pajamas.            11.  Place clean sheets on your bed the night of your first shower and do not  sleep with pets.  ON THE DAY OF SURGERY : Do not apply any lotions/deodorants the morning of surgery.  Please wear clean clothes to the hospital/surgery center.    FAILURE TO FOLLOW THESE INSTRUCTIONS MAY RESULT IN THE CANCELLATION OF YOUR SURGERY  PATIENT SIGNATURE_________________________________  NURSE SIGNATURE__________________________________  ________________________________________________________________________    Adam Phenix    An incentive spirometer is a tool that can help keep your lungs clear and active. This tool measures how well you are filling  your lungs with each breath. Taking long deep breaths may help reverse or decrease the chance of developing breathing (pulmonary) problems (especially infection) following: A long period of time when you are unable to move or be active. BEFORE THE PROCEDURE  If the spirometer includes an indicator to show your best effort, your nurse or respiratory therapist will set it to a desired goal. If possible, sit up straight or lean slightly forward. Try not to slouch. Hold the incentive spirometer in an upright position. INSTRUCTIONS FOR USE  Sit on the edge of your bed if possible, or sit up as far as you can in bed or on a chair. Hold the incentive spirometer in an upright position. Breathe out normally. Place the mouthpiece in your mouth and seal your lips tightly around it. Breathe in slowly and as deeply as possible, raising the piston or the ball toward the top of the column. Hold your breath for 3-5 seconds or for as long as possible. Allow the piston or ball to fall to the bottom of the column. Remove the mouthpiece from your mouth and breathe out normally. Rest for a few seconds and repeat Steps 1 through 7 at least 10 times every 1-2 hours when you are awake. Take your time and take a few normal breaths between deep breaths. The spirometer may include an indicator to show your best effort. Use the indicator as a goal to work toward during each repetition. After each set of 10 deep breaths, practice coughing to be sure your lungs are clear. If you have an incision (the cut made at the time of surgery), support your incision when coughing by placing a pillow or rolled up towels firmly against it. Once you are able to get out of bed, walk around indoors and cough well. You may stop using the incentive spirometer when instructed by your caregiver.  RISKS AND COMPLICATIONS Take your time so you do not get dizzy or light-headed. If you are in pain, you may need to take or ask for pain medication  before doing incentive spirometry. It is harder to take a deep breath if you are having pain. AFTER USE Rest and breathe slowly and easily. It can be helpful to keep track of a log of your progress. Your caregiver can provide you with a simple table to help with this. If you are using the spirometer at home, follow these instructions: [place] IF:  You are having difficultly using the spirometer. You have trouble using the spirometer as often as instructed. Your pain medication is not giving enough relief while using the spirometer. You develop fever of 100.5 F (38.1 C) or higher.  SEEK IMMEDIATE MEDICAL CARE IF:  You cough up bloody sputum that had not been present before. You develop fever of 102 F (38.9 C) or greater. You develop worsening pain at or near the incision site. MAKE SURE YOU:  Understand these instructions. Will watch your condition. Will get help right away if you are not doing well or get worse. Document Released: 11/09/2006 Document Revised: 09/21/2011 Document Reviewed: 01/10/2007 [facility] Patient Information 2014 [place], [place].

## 2022-02-11 NOTE — Progress Notes (Signed)
COVID Vaccine received:  '[]'$  No '[x]'$  Yes  Date of any COVID positive Test in last 90 days:None  PCP - Merrilee Seashore. MD Cardiologist - Quay Burow, MD    Cardiac Clearance- Almyra Deforest, PA-C  12-11-21 Epic  Chest x-ray - 2012 Epic EKG -  08-27-21  Epic Stress Test - 2012  Epic ECHO - 2009 Epic Cardiac Cath - 1997 & 2012 has DES stent  Pacemaker/ICD device     '[x]'$  N/A Spinal Cord Stimulator:'[x]'$  No '[]'$  Yes   Other Implants:   History of Sleep Apnea? '[]'$  No '[x]'$  Yes  '[]'$  unknown Sleep Study Date:  2009   Mild OSA CPAP used?- '[x]'$  No '[]'$  Yes    Does the patient monitor blood sugar? '[x]'$  No '[]'$  Yes  Fasting Blood Sugar Ranges- ?     Hgb AIC usually 6.5-6.7 Checks Blood Sugar __0 times a day  Blood Thinner Instructions: none Aspirin Instructions:  ASA 81 mg  Holding 5-7 days, patient is going to stop Monday 02-16-22  ERAS Protocol Ordered: '[]'$  No  '[x]'$  Yes PRE-SURGERY '[]'$  ENSURE  '[x]'$  G2   Comments:   Activity level: Patient can not walk up a flight of stairs without difficulty; he would have no CP or SOB but would have leg pain.   Anesthesia review: Bradycardia, CAD- hx DES, Renal artery aneurysm resecton 2010, HTN, Pre-DM  Patient denies shortness of breath, fever, cough and chest pain at PAT appointment.Patient verbalized understanding and agreement to the Pre-Surgical Instructions that were given to them at this PAT appointment. Patient was also educated of the need to review these PAT instructions again prior to his/her surgery.I reviewed the appropriate phone numbers to call if they have any and questions or concerns.

## 2022-02-12 ENCOUNTER — Encounter (HOSPITAL_COMMUNITY): Payer: Self-pay

## 2022-02-12 ENCOUNTER — Other Ambulatory Visit: Payer: Self-pay

## 2022-02-12 ENCOUNTER — Encounter (HOSPITAL_COMMUNITY)
Admission: RE | Admit: 2022-02-12 | Discharge: 2022-02-12 | Disposition: A | Discharge status: Home / Self Care | Payer: Medicare Other | Source: Ambulatory Visit | Attending: Orthopedic Surgery | Admitting: Orthopedic Surgery

## 2022-02-12 VITALS — BP 153/86 | HR 79 | Temp 98.8°F | Resp 14 | Ht 70.0 in | Wt 170.0 lb

## 2022-02-12 DIAGNOSIS — R7303 Prediabetes: Secondary | ICD-10-CM | POA: Diagnosis not present

## 2022-02-12 DIAGNOSIS — I10 Essential (primary) hypertension: Secondary | ICD-10-CM | POA: Insufficient documentation

## 2022-02-12 DIAGNOSIS — G4733 Obstructive sleep apnea (adult) (pediatric): Secondary | ICD-10-CM | POA: Diagnosis not present

## 2022-02-12 DIAGNOSIS — M1611 Unilateral primary osteoarthritis, right hip: Secondary | ICD-10-CM | POA: Diagnosis not present

## 2022-02-12 DIAGNOSIS — Z01818 Encounter for other preprocedural examination: Secondary | ICD-10-CM

## 2022-02-12 DIAGNOSIS — M25551 Pain in right hip: Secondary | ICD-10-CM | POA: Insufficient documentation

## 2022-02-12 DIAGNOSIS — Z01812 Encounter for preprocedural laboratory examination: Secondary | ICD-10-CM | POA: Insufficient documentation

## 2022-02-12 DIAGNOSIS — G8929 Other chronic pain: Secondary | ICD-10-CM | POA: Diagnosis not present

## 2022-02-12 DIAGNOSIS — I2510 Atherosclerotic heart disease of native coronary artery without angina pectoris: Secondary | ICD-10-CM | POA: Insufficient documentation

## 2022-02-12 HISTORY — DX: Unspecified osteoarthritis, unspecified site: M19.90

## 2022-02-12 HISTORY — DX: Prediabetes: R73.03

## 2022-02-12 LAB — CBC WITH DIFFERENTIAL/PLATELET
Abs Immature Granulocytes: 0.02 10*3/uL (ref 0.00–0.07)
Basophils Absolute: 0 10*3/uL (ref 0.0–0.1)
Basophils Relative: 1 %
Eosinophils Absolute: 0.2 10*3/uL (ref 0.0–0.5)
Eosinophils Relative: 3 %
HCT: 43.4 % (ref 39.0–52.0)
Hemoglobin: 14.2 g/dL (ref 13.0–17.0)
Immature Granulocytes: 0 %
Lymphocytes Relative: 21 %
Lymphs Abs: 1.2 10*3/uL (ref 0.7–4.0)
MCH: 31.8 pg (ref 26.0–34.0)
MCHC: 32.7 g/dL (ref 30.0–36.0)
MCV: 97.1 fL (ref 80.0–100.0)
Monocytes Absolute: 0.5 10*3/uL (ref 0.1–1.0)
Monocytes Relative: 9 %
Neutro Abs: 3.6 10*3/uL (ref 1.7–7.7)
Neutrophils Relative %: 66 %
Platelets: 200 10*3/uL (ref 150–400)
RBC: 4.47 MIL/uL (ref 4.22–5.81)
RDW: 12.5 % (ref 11.5–15.5)
WBC: 5.5 10*3/uL (ref 4.0–10.5)
nRBC: 0 % (ref 0.0–0.2)

## 2022-02-12 LAB — COMPREHENSIVE METABOLIC PANEL
ALT: 22 U/L (ref 0–44)
AST: 24 U/L (ref 15–41)
Albumin: 4.1 g/dL (ref 3.5–5.0)
Alkaline Phosphatase: 83 U/L (ref 38–126)
Anion gap: 7 (ref 5–15)
BUN: 27 mg/dL — ABNORMAL HIGH (ref 8–23)
CO2: 24 mmol/L (ref 22–32)
Calcium: 8.7 mg/dL — ABNORMAL LOW (ref 8.9–10.3)
Chloride: 102 mmol/L (ref 98–111)
Creatinine, Ser: 0.99 mg/dL (ref 0.61–1.24)
GFR, Estimated: >60 mL/min (ref >60)
Glucose, Bld: 229 mg/dL — ABNORMAL HIGH (ref 70–99)
Potassium: 4.2 mmol/L (ref 3.5–5.1)
Sodium: 133 mmol/L — ABNORMAL LOW (ref 135–145)
Total Bilirubin: 0.5 mg/dL (ref 0.3–1.2)
Total Protein: 6.9 g/dL (ref 6.5–8.1)

## 2022-02-12 LAB — GLUCOSE, CAPILLARY: Glucose-Capillary: 237 mg/dL — ABNORMAL HIGH (ref 70–99)

## 2022-02-12 LAB — HEMOGLOBIN A1C
Hgb A1c MFr Bld: 6.1 % — ABNORMAL HIGH (ref 4.8–5.6)
Mean Plasma Glucose: 128.37 mg/dL

## 2022-02-12 LAB — TYPE AND SCREEN
ABO/RH(D): B POS
Antibody Screen: NEGATIVE

## 2022-02-12 LAB — SURGICAL PCR SCREEN
MRSA, PCR: NEGATIVE
Staphylococcus aureus: NEGATIVE

## 2022-02-13 NOTE — Progress Notes (Signed)
Anesthesia Chart Review  Case: [id number] Date/Time: 02/23/22 0700   Procedure: TOTAL HIP ARTHROPLASTY (Right: Hip)   Anesthesia type: Spinal   Pre-op diagnosis: OA RIGHT HIP   Location: [facility] / [facility] ORS   Surgeons: Willaim Sheng, MD       DISCUSSION:76 y.o. never smoker with h/o HTN, CAD (DES), OSA on CPAP, renal artery aneurysm s/p surgical resection at [facility] in 2010, right hip OA scheduled for above procedure 02/23/2022 with Dr. Charlies Constable.   Pt last seen by cardiology 12/11/2021. Per OV note, "        -Patient has upcoming right total hip replacement surgery.  He denies any chest pain or worsening shortness of breath.  Last PCI was in 2012.  He recently traveled to [place] and [place] and was able to ambulate long distance oversee without any exertional symptoms or chest pain.  He is cleared to proceed with the upcoming surgery without further work-up.  He may hold aspirin and the fish oil for 7 days prior to the procedure and restart as soon as possible afterward at the surgeon's discretion."  Anticipate pt can proceed with planned procedure barring acute status change.   VS: BP (!) 153/86   Pulse 79   Temp 37.1 C (Oral)   Resp 14   Ht '5\' 10"'$  (1.778 m)   Wt 77.1 kg   SpO2 98%   BMI 24.39 kg/m   PROVIDERS: Merrilee Seashore, MD is PCP   Cardiologist - Quay Burow, MD LABS: Labs reviewed: Acceptable for surgery. (all labs ordered are listed, but only abnormal results are displayed)  Labs Reviewed  COMPREHENSIVE METABOLIC PANEL - Abnormal; Notable for the following components:      Result Value   Sodium 133 (*)    Glucose, Bld 229 (*)    BUN 27 (*)    Calcium 8.7 (*)    All other components within normal limits  HEMOGLOBIN A1C - Abnormal; Notable for the following components:   Hgb A1c MFr Bld 6.1 (*)    All other components within normal limits  GLUCOSE, CAPILLARY - Abnormal; Notable for the following components:   Glucose-Capillary 237 (*)     All other components within normal limits  SURGICAL PCR SCREEN  CBC WITH DIFFERENTIAL/PLATELET  TYPE AND SCREEN     IMAGES:   EKG:   CV:  Past Medical History:  Diagnosis Date   Aneurysm artery, renal ([place]) 07/13/2008   Surgery 2010 at [facility]    Coronary artery disease 03/13/1996   LAD PCI '97, LAD/CFX DES 7/12   Hyperlipidemia    PCP follows   Hypertension    OSA on CPAP 07/13/2008   Mild OSA doesn't use CPAP   Pre-diabetes    Sleep apnea    Does not have CPAP   Vertigo     Past Surgical History:  Procedure Laterality Date   COLONOSCOPY     [place]    CORONARY STENT PLACEMENT  04/06/96 & 01/29/11   LAD PCI '97, LAD/CFX DES 7/12   DOPPLER ECHOCARDIOGRAPHY  03/09/2008   EF 50-55%   EYE SURGERY Bilateral    Cataract   NM MYOCAR PERF WALL MOTION  03/09/2008   subtle septal ischemia which decide to treat mediclly   NM MYOCAR PERF WALL MOTION  01/16/2011   showed mild to moderate septal ischemia   renal arterial aneursym  05/21/2009   surgerical resectd by Dr Kennith Gain at [facility]  WISDOM TOOTH EXTRACTION      MEDICATIONS:  amLODipine (NORVASC) 5 MG tablet   aspirin 81 MG tablet   atorvastatin (LIPITOR) 20 MG tablet   Calcium Carb-Cholecalciferol (CALCIUM 500/VITAMIN D PO)   Coenzyme Q10 (COQ10) 100 MG CAPS   Melatonin 10 MG TABS   metoprolol tartrate (LOPRESSOR) 25 MG tablet   Multiple Vitamin (MULTIVITAMIN) tablet   olmesartan (BENICAR) 40 MG tablet   Omega-3 Fatty Acids (FISH OIL) 1200 MG CAPS   No current facility-administered medications for this encounter.     Konrad Felix Ward, PA-C [facility] Pre-Surgical Testing [phone]

## 2022-02-22 MED ORDER — TRANEXAMIC ACID 1000 MG/10ML IV SOLN
2000.0000 mg | INTRAVENOUS | Status: DC
  Filled 2022-02-22: qty 20

## 2022-02-22 NOTE — Anesthesia Preprocedure Evaluation (Addendum)
Anesthesia Evaluation  Patient identified by MRN, date of birth, ID band Patient awake    Reviewed: Allergy & Precautions, NPO status , Patient's Chart, lab work & pertinent test results  Airway Mallampati: II  TM Distance: >3 FB Neck ROM: Full    Dental no notable dental hx. (+) Dental Advisory Given   Pulmonary sleep apnea ,    Pulmonary exam normal breath sounds clear to auscultation       Cardiovascular hypertension, Pt. on medications and Pt. on home beta blockers + CAD, + Cardiac Stents and + Peripheral Vascular Disease   Rhythm:Regular Rate:Normal + Systolic murmurs    Neuro/Psych negative neurological ROS     GI/Hepatic negative GI ROS, Neg liver ROS,   Endo/Other  negative endocrine ROS  Renal/GU negative Renal ROS     Musculoskeletal  (+) Arthritis ,   Abdominal   Peds  Hematology negative hematology ROS (+)   Anesthesia Other Findings   Reproductive/Obstetrics                          Anesthesia Physical Anesthesia Plan  ASA: 3  Anesthesia Plan: Spinal   Post-op Pain Management: Tylenol PO (pre-op)* and Celebrex PO (pre-op)*   Induction: Intravenous  PONV Risk Score and Plan: Ondansetron, TIVA, Treatment may vary due to age or medical condition and Propofol infusion  Airway Management Planned: Natural Airway  Additional Equipment:   Intra-op Plan:   Post-operative Plan:   Informed Consent: I have reviewed the patients History and Physical, chart, labs and discussed the procedure including the risks, benefits and alternatives for the proposed anesthesia with the patient or authorized representative who has indicated his/her understanding and acceptance.     Dental advisory given  Plan Discussed with: CRNA  Anesthesia Plan Comments:       Anesthesia Quick Evaluation

## 2022-02-23 ENCOUNTER — Ambulatory Visit (HOSPITAL_COMMUNITY)
Admission: RE | Admit: 2022-02-23 | Discharge: 2022-02-23 | Disposition: A | Discharge status: Home / Self Care | Payer: Medicare Other | Source: Ambulatory Visit | Attending: Orthopedic Surgery | Admitting: Orthopedic Surgery

## 2022-02-23 ENCOUNTER — Other Ambulatory Visit: Payer: Self-pay

## 2022-02-23 ENCOUNTER — Ambulatory Visit (HOSPITAL_COMMUNITY): Payer: Medicare Other

## 2022-02-23 ENCOUNTER — Ambulatory Visit (HOSPITAL_BASED_OUTPATIENT_CLINIC_OR_DEPARTMENT_OTHER): Payer: Medicare Other | Admitting: Certified Registered"

## 2022-02-23 ENCOUNTER — Ambulatory Visit (HOSPITAL_COMMUNITY): Payer: Medicare Other | Admitting: Physician Assistant

## 2022-02-23 ENCOUNTER — Encounter (HOSPITAL_COMMUNITY)
Admission: RE | Disposition: A | Discharge status: Home / Self Care | Payer: Self-pay | Source: Ambulatory Visit | Attending: Orthopedic Surgery

## 2022-02-23 ENCOUNTER — Encounter (HOSPITAL_COMMUNITY): Payer: Self-pay | Admitting: Orthopedic Surgery

## 2022-02-23 DIAGNOSIS — G4730 Sleep apnea, unspecified: Secondary | ICD-10-CM

## 2022-02-23 DIAGNOSIS — M1611 Unilateral primary osteoarthritis, right hip: Secondary | ICD-10-CM

## 2022-02-23 DIAGNOSIS — M879 Osteonecrosis, unspecified: Secondary | ICD-10-CM | POA: Diagnosis not present

## 2022-02-23 DIAGNOSIS — I10 Essential (primary) hypertension: Secondary | ICD-10-CM | POA: Diagnosis not present

## 2022-02-23 DIAGNOSIS — I2510 Atherosclerotic heart disease of native coronary artery without angina pectoris: Secondary | ICD-10-CM | POA: Insufficient documentation

## 2022-02-23 DIAGNOSIS — Z96641 Presence of right artificial hip joint: Secondary | ICD-10-CM | POA: Diagnosis not present

## 2022-02-23 DIAGNOSIS — R7303 Prediabetes: Secondary | ICD-10-CM

## 2022-02-23 DIAGNOSIS — Z471 Aftercare following joint replacement surgery: Secondary | ICD-10-CM | POA: Diagnosis not present

## 2022-02-23 DIAGNOSIS — M87851 Other osteonecrosis, right femur: Secondary | ICD-10-CM | POA: Diagnosis not present

## 2022-02-23 HISTORY — PX: TOTAL HIP ARTHROPLASTY: SHX124

## 2022-02-23 LAB — ABO/RH: ABO/RH(D): B POS

## 2022-02-23 LAB — GLUCOSE, CAPILLARY: Glucose-Capillary: 138 mg/dL — ABNORMAL HIGH (ref 70–99)

## 2022-02-23 SURGERY — ARTHROPLASTY, HIP, TOTAL,POSTERIOR APPROACH
Anesthesia: Spinal | Site: Hip | Laterality: Right

## 2022-02-23 MED ORDER — DEXAMETHASONE SODIUM PHOSPHATE 10 MG/ML IJ SOLN
INTRAMUSCULAR | Status: AC
  Filled 2022-02-23: qty 1

## 2022-02-23 MED ORDER — SODIUM CHLORIDE 0.9 % IV SOLN: INTRAVENOUS | Status: DC

## 2022-02-23 MED ORDER — KETOROLAC TROMETHAMINE 15 MG/ML IJ SOLN
INTRAMUSCULAR | Status: DC
Start: 2022-02-23 — End: 2022-02-23
  Filled 2022-02-23: qty 1

## 2022-02-23 MED ORDER — LACTATED RINGERS IV BOLUS
250.0000 mL | Freq: Once | INTRAVENOUS | Status: AC
  Administered 2022-02-23: 250 mL via INTRAVENOUS

## 2022-02-23 MED ORDER — PHENYLEPHRINE 80 MCG/ML (10ML) SYRINGE FOR IV PUSH (FOR BLOOD PRESSURE SUPPORT)
PREFILLED_SYRINGE | INTRAVENOUS | Status: AC
  Filled 2022-02-23: qty 10

## 2022-02-23 MED ORDER — OXYCODONE HCL 5 MG PO TABS
ORAL_TABLET | ORAL | Status: AC
  Administered 2022-02-23: 5 mg via ORAL
  Filled 2022-02-23: qty 1

## 2022-02-23 MED ORDER — WATER FOR IRRIGATION, STERILE IR SOLN
Status: DC | PRN
  Administered 2022-02-23: 2000 mL

## 2022-02-23 MED ORDER — OXYCODONE HCL 5 MG PO TABS: 5.0000 mg | ORAL_TABLET | ORAL | 0 refills | Status: AC | PRN

## 2022-02-23 MED ORDER — CHLORHEXIDINE GLUCONATE 0.12 % MT SOLN
15.0000 mL | Freq: Once | OROMUCOSAL | Status: AC
  Administered 2022-02-23: 15 mL via OROMUCOSAL

## 2022-02-23 MED ORDER — TRANEXAMIC ACID-NACL 1000-0.7 MG/100ML-% IV SOLN
1000.0000 mg | INTRAVENOUS | Status: AC
  Administered 2022-02-23: 1000 mg via INTRAVENOUS
  Filled 2022-02-23: qty 100

## 2022-02-23 MED ORDER — OXYCODONE HCL 5 MG PO TABS
5.0000 mg | ORAL_TABLET | ORAL | Status: DC | PRN
  Administered 2022-02-23: 5 mg via ORAL

## 2022-02-23 MED ORDER — HYDROMORPHONE HCL 1 MG/ML IJ SOLN
0.2500 mg | INTRAMUSCULAR | Status: DC | PRN
  Administered 2022-02-23: 0.5 mg via INTRAVENOUS

## 2022-02-23 MED ORDER — ONDANSETRON HCL 4 MG PO TABS: 4.0000 mg | ORAL_TABLET | Freq: Three times a day (TID) | ORAL | 0 refills | Status: AC | PRN

## 2022-02-23 MED ORDER — CEFAZOLIN SODIUM-DEXTROSE 2-4 GM/100ML-% IV SOLN
2.0000 g | Freq: Four times a day (QID) | INTRAVENOUS | Status: DC
  Administered 2022-02-23: 2 g via INTRAVENOUS

## 2022-02-23 MED ORDER — BUPIVACAINE LIPOSOME 1.3 % IJ SUSP
INTRAMUSCULAR | Status: AC
  Filled 2022-02-23: qty 20

## 2022-02-23 MED ORDER — ACETAMINOPHEN 500 MG PO TABS
1000.0000 mg | ORAL_TABLET | Freq: Once | ORAL | Status: AC
  Administered 2022-02-23: 1000 mg via ORAL
  Filled 2022-02-23: qty 2

## 2022-02-23 MED ORDER — OXYCODONE HCL 5 MG PO TABS
ORAL_TABLET | ORAL | Status: AC
  Filled 2022-02-23: qty 1

## 2022-02-23 MED ORDER — FENTANYL CITRATE (PF) 100 MCG/2ML IJ SOLN
INTRAMUSCULAR | Status: DC | PRN
  Administered 2022-02-23 (×2): 25 ug via INTRAVENOUS

## 2022-02-23 MED ORDER — LACTATED RINGERS IV BOLUS
500.0000 mL | Freq: Once | INTRAVENOUS | Status: AC
  Administered 2022-02-23: 500 mL via INTRAVENOUS

## 2022-02-23 MED ORDER — CELECOXIB 100 MG PO CAPS: 100.0000 mg | ORAL_CAPSULE | Freq: Two times a day (BID) | ORAL | 0 refills | Status: AC

## 2022-02-23 MED ORDER — HYDROMORPHONE HCL 1 MG/ML IJ SOLN: 0.5000 mg | INTRAMUSCULAR | Status: DC | PRN

## 2022-02-23 MED ORDER — PROPOFOL 10 MG/ML IV BOLUS
INTRAVENOUS | Status: DC | PRN
  Administered 2022-02-23: 30 mg via INTRAVENOUS

## 2022-02-23 MED ORDER — SODIUM CHLORIDE (PF) 0.9 % IJ SOLN
INTRAMUSCULAR | Status: AC
Start: 2022-02-23 — End: ?
  Filled 2022-02-23: qty 10

## 2022-02-23 MED ORDER — PHENYLEPHRINE 80 MCG/ML (10ML) SYRINGE FOR IV PUSH (FOR BLOOD PRESSURE SUPPORT)
PREFILLED_SYRINGE | INTRAVENOUS | Status: DC | PRN
  Administered 2022-02-23: 80 ug via INTRAVENOUS
  Administered 2022-02-23 (×2): 40 ug via INTRAVENOUS

## 2022-02-23 MED ORDER — SODIUM CHLORIDE 0.9 % IR SOLN
Status: DC | PRN
  Administered 2022-02-23: 3000 mL

## 2022-02-23 MED ORDER — PROPOFOL 500 MG/50ML IV EMUL
INTRAVENOUS | Status: DC | PRN
  Administered 2022-02-23: 40 ug/kg/min via INTRAVENOUS

## 2022-02-23 MED ORDER — EPHEDRINE SULFATE-NACL 50-0.9 MG/10ML-% IV SOSY
PREFILLED_SYRINGE | INTRAVENOUS | Status: DC | PRN
  Administered 2022-02-23 (×5): 5 mg via INTRAVENOUS

## 2022-02-23 MED ORDER — PROPOFOL 10 MG/ML IV BOLUS
INTRAVENOUS | Status: AC
  Filled 2022-02-23: qty 20

## 2022-02-23 MED ORDER — ISOPROPYL ALCOHOL 70 % SOLN
Status: AC
  Filled 2022-02-23: qty 480

## 2022-02-23 MED ORDER — ACETAMINOPHEN 500 MG PO TABS: 1000.0000 mg | ORAL_TABLET | Freq: Three times a day (TID) | ORAL | 0 refills | Status: AC | PRN

## 2022-02-23 MED ORDER — HYDROMORPHONE HCL 1 MG/ML IJ SOLN
INTRAMUSCULAR | Status: AC
  Filled 2022-02-23: qty 2

## 2022-02-23 MED ORDER — CEFAZOLIN SODIUM-DEXTROSE 2-4 GM/100ML-% IV SOLN
2.0000 g | INTRAVENOUS | Status: AC
  Administered 2022-02-23: 2 g via INTRAVENOUS
  Filled 2022-02-23: qty 100

## 2022-02-23 MED ORDER — PROPOFOL 1000 MG/100ML IV EMUL
INTRAVENOUS | Status: AC
  Filled 2022-02-23: qty 100

## 2022-02-23 MED ORDER — ONDANSETRON HCL 4 MG/2ML IJ SOLN
INTRAMUSCULAR | Status: DC | PRN
  Administered 2022-02-23: 4 mg via INTRAVENOUS

## 2022-02-23 MED ORDER — CELECOXIB 200 MG PO CAPS
200.0000 mg | ORAL_CAPSULE | Freq: Once | ORAL | Status: AC
  Administered 2022-02-23: 200 mg via ORAL
  Filled 2022-02-23: qty 1

## 2022-02-23 MED ORDER — EPHEDRINE 5 MG/ML INJ
INTRAVENOUS | Status: AC
  Filled 2022-02-23: qty 5

## 2022-02-23 MED ORDER — LACTATED RINGERS IV SOLN: INTRAVENOUS | Status: DC

## 2022-02-23 MED ORDER — METHOCARBAMOL 500 MG PO TABS: 500.0000 mg | ORAL_TABLET | Freq: Three times a day (TID) | ORAL | 0 refills | Status: AC | PRN

## 2022-02-23 MED ORDER — DEXAMETHASONE SODIUM PHOSPHATE 10 MG/ML IJ SOLN
8.0000 mg | Freq: Once | INTRAMUSCULAR | Status: AC
  Administered 2022-02-23: 8 mg via INTRAVENOUS

## 2022-02-23 MED ORDER — MIDAZOLAM HCL 2 MG/2ML IJ SOLN
INTRAMUSCULAR | Status: DC | PRN
  Administered 2022-02-23: 2 mg via INTRAVENOUS

## 2022-02-23 MED ORDER — 0.9 % SODIUM CHLORIDE (POUR BTL) OPTIME
TOPICAL | Status: DC | PRN
  Administered 2022-02-23: 1000 mL

## 2022-02-23 MED ORDER — POVIDONE-IODINE 10 % EX SWAB
2.0000 | Freq: Once | CUTANEOUS | Status: AC
  Administered 2022-02-23: 2 via TOPICAL

## 2022-02-23 MED ORDER — PHENYLEPHRINE HCL-NACL 20-0.9 MG/250ML-% IV SOLN
INTRAVENOUS | Status: DC | PRN
  Administered 2022-02-23: 20 ug/min via INTRAVENOUS

## 2022-02-23 MED ORDER — METHOCARBAMOL 500 MG IVPB - SIMPLE MED: 500.0000 mg | Freq: Four times a day (QID) | INTRAVENOUS | Status: DC | PRN

## 2022-02-23 MED ORDER — BUPIVACAINE IN DEXTROSE 0.75-8.25 % IT SOLN
INTRATHECAL | Status: DC | PRN
  Administered 2022-02-23: 2 mL via INTRATHECAL

## 2022-02-23 MED ORDER — CEFAZOLIN SODIUM-DEXTROSE 2-4 GM/100ML-% IV SOLN
INTRAVENOUS | Status: AC
  Filled 2022-02-23: qty 100

## 2022-02-23 MED ORDER — ASPIRIN 81 MG PO TABS
81.0000 mg | ORAL_TABLET | Freq: Two times a day (BID) | ORAL | 0 refills | Status: AC
Start: 2022-02-23 — End: 2022-03-25

## 2022-02-23 MED ORDER — METHOCARBAMOL 500 MG PO TABS: 500.0000 mg | ORAL_TABLET | Freq: Four times a day (QID) | ORAL | Status: DC | PRN

## 2022-02-23 MED ORDER — ONDANSETRON HCL 4 MG/2ML IJ SOLN
INTRAMUSCULAR | Status: AC
  Filled 2022-02-23: qty 2

## 2022-02-23 MED ORDER — BUPIVACAINE LIPOSOME 1.3 % IJ SUSP
INTRAMUSCULAR | Status: DC | PRN
  Administered 2022-02-23: 20 mL

## 2022-02-23 MED ORDER — ORAL CARE MOUTH RINSE: 15.0000 mL | Freq: Once | OROMUCOSAL | Status: AC

## 2022-02-23 MED ORDER — SODIUM CHLORIDE (PF) 0.9 % IJ SOLN
INTRAMUSCULAR | Status: AC
  Filled 2022-02-23: qty 50

## 2022-02-23 MED ORDER — MIDAZOLAM HCL 2 MG/2ML IJ SOLN
INTRAMUSCULAR | Status: AC
  Filled 2022-02-23: qty 2

## 2022-02-23 MED ORDER — METHOCARBAMOL 500 MG IVPB - SIMPLE MED
INTRAVENOUS | Status: AC
  Administered 2022-02-23: 500 mg via INTRAVENOUS
  Filled 2022-02-23: qty 55

## 2022-02-23 MED ORDER — AMISULPRIDE (ANTIEMETIC) 5 MG/2ML IV SOLN: 10.0000 mg | Freq: Once | INTRAVENOUS | Status: DC | PRN

## 2022-02-23 MED ORDER — BUPIVACAINE LIPOSOME 1.3 % IJ SUSP: 10.0000 mL | Freq: Once | INTRAMUSCULAR | Status: DC

## 2022-02-23 MED ORDER — KETOROLAC TROMETHAMINE 15 MG/ML IJ SOLN
7.5000 mg | Freq: Four times a day (QID) | INTRAMUSCULAR | Status: DC
  Administered 2022-02-23: 7.5 mg via INTRAVENOUS

## 2022-02-23 MED ORDER — MEPERIDINE HCL 50 MG/ML IJ SOLN: 6.2500 mg | INTRAMUSCULAR | Status: DC | PRN

## 2022-02-23 MED ORDER — ISOPROPYL ALCOHOL 70 % SOLN
Status: DC | PRN
  Administered 2022-02-23: 1 via TOPICAL

## 2022-02-23 MED ORDER — SODIUM CHLORIDE (PF) 0.9 % IJ SOLN
INTRAMUSCULAR | Status: DC | PRN
  Administered 2022-02-23: 60 mL

## 2022-02-23 MED ORDER — FENTANYL CITRATE (PF) 100 MCG/2ML IJ SOLN
INTRAMUSCULAR | Status: AC
  Filled 2022-02-23: qty 2

## 2022-02-23 SURGICAL SUPPLY — 75 items
ADH SKN CLS APL DERMABOND .7 (GAUZE/BANDAGES/DRESSINGS) ×1
APL PRP STRL LF DISP 70% ISPRP (MISCELLANEOUS) ×2
BAG COUNTER SPONGE SURGICOUNT (BAG) ×1 IMPLANT
BAG DECANTER FOR FLEXI CONT (MISCELLANEOUS) ×2 IMPLANT
BAG SPEC THK2 15X12 ZIP CLS (MISCELLANEOUS) ×2
BAG SPNG CNTER NS LX DISP (BAG) ×1
BAG ZIPLOCK 12X15 (MISCELLANEOUS) ×3 IMPLANT
BLADE SAW SAG 25X90X1.19 (BLADE) ×2 IMPLANT
CHLORAPREP W/TINT 26 (MISCELLANEOUS) ×4 IMPLANT
CNTNR URN SCR LID CUP LEK RST (MISCELLANEOUS) IMPLANT
CONT SPEC 4OZ STRL OR WHT (MISCELLANEOUS) ×2
COVER SURGICAL LIGHT HANDLE (MISCELLANEOUS) ×2 IMPLANT
DERMABOND ADVANCED (GAUZE/BANDAGES/DRESSINGS) ×1
DERMABOND ADVANCED .7 DNX12 (GAUZE/BANDAGES/DRESSINGS) ×1 IMPLANT
DRAPE HIP W/POCKET STRL (MISCELLANEOUS) ×2 IMPLANT
DRAPE INCISE IOBAN 66X45 STRL (DRAPES) ×2 IMPLANT
DRAPE INCISE IOBAN 85X60 (DRAPES) ×2 IMPLANT
DRAPE POUCH INSTRU U-SHP 10X18 (DRAPES) ×2 IMPLANT
DRAPE SHEET LG 3/4 BI-LAMINATE (DRAPES) ×6 IMPLANT
DRAPE SURG 17X11 SM STRL (DRAPES) ×2 IMPLANT
DRAPE U-SHAPE 47X51 STRL (DRAPES) ×4 IMPLANT
DRESSING AQUACEL AG SP 3.5X10 (GAUZE/BANDAGES/DRESSINGS) ×1 IMPLANT
DRSG AQUACEL AG SP 3.5X10 (GAUZE/BANDAGES/DRESSINGS) ×2
ELECT BLADE TIP CTD 4 INCH (ELECTRODE) ×2 IMPLANT
ELECT REM PT RETURN 15FT ADLT (MISCELLANEOUS) ×2 IMPLANT
GLOVE BIO SURGEON STRL SZ 6.5 (GLOVE) ×2 IMPLANT
GLOVE BIO SURGEON STRL SZ7.5 (GLOVE) ×2 IMPLANT
GLOVE BIOGEL PI IND STRL 6.5 (GLOVE) IMPLANT
GLOVE BIOGEL PI IND STRL 7.5 (GLOVE) IMPLANT
GLOVE BIOGEL PI IND STRL 8 (GLOVE) ×1 IMPLANT
GLOVE BIOGEL PI INDICATOR 6.5 (GLOVE) ×1
GLOVE BIOGEL PI INDICATOR 7.5 (GLOVE) ×2
GLOVE BIOGEL PI INDICATOR 8 (GLOVE) ×1
GLOVE SURG ORTHO 8.0 STRL STRW (GLOVE) ×4 IMPLANT
GOWN STRL REUS W/ TWL XL LVL3 (GOWN DISPOSABLE) ×1 IMPLANT
GOWN STRL REUS W/TWL XL LVL3 (GOWN DISPOSABLE) ×2
HANDPIECE INTERPULSE COAX TIP (DISPOSABLE) ×2
HEAD BIOLOX HIP 36/+2.5 (Joint) IMPLANT
HIP BIOLOX HD 36/+2.5 (Joint) ×2 IMPLANT
HOLDER FOLEY CATH W/STRAP (MISCELLANEOUS) ×2 IMPLANT
HOOD PEEL AWAY FLYTE STAYCOOL (MISCELLANEOUS) ×6 IMPLANT
INSERT TRIDENT POLY 36 0DEG (Insert) ×1 IMPLANT
KIT BASIN OR (CUSTOM PROCEDURE TRAY) ×2 IMPLANT
KIT TURNOVER KIT A (KITS) ×1 IMPLANT
MANIFOLD NEPTUNE II (INSTRUMENTS) ×2 IMPLANT
MARKER SKIN DUAL TIP RULER LAB (MISCELLANEOUS) ×2 IMPLANT
NEEDLE HYPO 22GX1.5 SAFETY (NEEDLE) IMPLANT
NS IRRIG 1000ML POUR BTL (IV SOLUTION) ×2 IMPLANT
PACK TOTAL JOINT (CUSTOM PROCEDURE TRAY) ×2 IMPLANT
PRESSURIZER FEMORAL UNIV (MISCELLANEOUS) IMPLANT
PROTECTOR NERVE ULNAR (MISCELLANEOUS) ×2 IMPLANT
RETRIEVER SUT HEWSON (MISCELLANEOUS) ×2 IMPLANT
SCREW HEX LP 6.5X25 (Screw) ×1 IMPLANT
SCREW HEX LP 6.5X30 (Screw) ×1 IMPLANT
SEALER BIPOLAR AQUA 6.0 (INSTRUMENTS) ×2 IMPLANT
SET HNDPC FAN SPRY TIP SCT (DISPOSABLE) IMPLANT
SHELL ACETABUL CLUSTER SZ 54 (Shell) ×1 IMPLANT
SPIKE FLUID TRANSFER (MISCELLANEOUS) ×6 IMPLANT
STEM ACCOLADE SZ 6 (Hips) ×1 IMPLANT
SUCTION FRAZIER HANDLE 12FR (TUBING) ×2
SUCTION TUBE FRAZIER 12FR DISP (TUBING) ×1 IMPLANT
SUT BONE WAX W31G (SUTURE) ×2 IMPLANT
SUT ETHIBOND #5 BRAIDED 30INL (SUTURE) ×2 IMPLANT
SUT MNCRL AB 3-0 PS2 18 (SUTURE) ×2 IMPLANT
SUT STRATAFIX 0 PDS 27 VIOLET (SUTURE) ×4
SUT STRATAFIX PDO 1 14 VIOLET (SUTURE) ×6
SUT STRATFX PDO 1 14 VIOLET (SUTURE) ×3
SUT VIC AB 2-0 CT2 27 (SUTURE) ×4 IMPLANT
SUTURE STRATFX 0 PDS 27 VIOLET (SUTURE) ×1 IMPLANT
SUTURE STRATFX PDO 1 14 VIOLET (SUTURE) ×1 IMPLANT
SYR 20ML LL LF (SYRINGE) ×4 IMPLANT
TOWEL OR 17X26 10 PK STRL BLUE (TOWEL DISPOSABLE) ×2 IMPLANT
TRAY FOLEY MTR SLVR 16FR STAT (SET/KITS/TRAYS/PACK) ×2 IMPLANT
UNDERPAD 30X36 HEAVY ABSORB (UNDERPADS AND DIAPERS) ×2 IMPLANT
WATER STERILE IRR 1000ML POUR (IV SOLUTION) ×4 IMPLANT

## 2022-02-23 NOTE — Anesthesia Postprocedure Evaluation (Signed)
Anesthesia Post Note  Patient: Evan [name]  Procedure(s) Performed: TOTAL HIP ARTHROPLASTY (Right: Hip)     Patient location during evaluation: PACU Anesthesia Type: Spinal Level of consciousness: awake and alert Pain management: pain level controlled Vital Signs Assessment: post-procedure vital signs reviewed and stable Respiratory status: spontaneous breathing Cardiovascular status: stable Anesthetic complications: no   No notable events documented.  Last Vitals:  Vitals:   02/23/22 1052 02/23/22 1100  BP: 113/61 108/76  Pulse: 64   Resp: 15 16  Temp: (!) 36.4 C 36.4 C  SpO2: 98% 96%    Last Pain:  Vitals:   02/23/22 1100  TempSrc:   PainSc: [place]

## 2022-02-23 NOTE — Evaluation (Signed)
Physical Therapy Evaluation Patient Details Name: Evan [name] MRN: [id number] DOB: 1945-09-16 Today's Date: 02/23/2022  History of Present Illness  Pt is 76 yo male s/p R posterior THA on 02/23/22.  No hip precautions per orders.  Pt with hx including but no limited to arthritis, HTN, vertigo.  Clinical Impression  Pt is s/p THA resulting in the deficits listed below (see PT Problem List). At baseline, pt is independent.  He has support at home and DME.  Pt seen in PACU for possible same day discharge but was limited by orthostatic hypotension.  Attempted slow transfers, exercises for circulation, hydrating, and deep breaths but still dizzy with attempts to stand/ambulate.  Will need further therapy prior to d/c. Pt will benefit from skilled PT to increase their independence and safety with mobility to allow discharge to the venue listed below.         Recommendations for follow up therapy are one component of a multi-disciplinary discharge planning process, led by the attending physician.  Recommendations may be updated based on patient status, additional functional criteria and insurance authorization.  Follow Up Recommendations Follow physician's recommendations for discharge plan and follow up therapies      Assistance Recommended at Discharge Intermittent Supervision/Assistance  Patient can return home with the following  A little help with walking and/or transfers;A little help with bathing/dressing/bathroom;Assistance with cooking/housework;Help with stairs or ramp for entrance    Equipment Recommendations None recommended by PT  Recommendations for Other Services       Functional Status Assessment Patient has had a recent decline in their functional status and demonstrates the ability to make significant improvements in function in a reasonable and predictable amount of time.     Precautions / Restrictions Precautions Precautions: Fall Precaution Comments: No hip  precautions Restrictions Weight Bearing Restrictions: Yes RLE Weight Bearing: Weight bearing as tolerated      Mobility  Bed Mobility Overal bed mobility: Needs Assistance Bed Mobility: Supine to Sit, Sit to Supine     Supine to sit: Min assist Sit to supine: Min assist   General bed mobility comments: Very light min A for R LE    Transfers Overall transfer level: Needs assistance Equipment used: Rolling walker (2 wheels) Transfers: Sit to/from Stand Sit to Stand: Min guard           General transfer comment: Educated on safe hand placement ; performed x 3    Ambulation/Gait Ambulation/Gait assistance: Herbalist (Feet): 50 Feet Assistive device: Rolling walker (2 wheels) Gait Pattern/deviations: Step-through pattern, Decreased stride length Gait velocity: decreased     General Gait Details: Min guard but had a LOB requring min A to recover; reports some dizziness (see comments)  Stairs            Wheelchair Mobility    Modified Rankin (Stroke Patients Only)       Balance Overall balance assessment: Needs assistance Sitting-balance support: No upper extremity supported Sitting balance-Leahy Scale: Fair     Standing balance support: Bilateral upper extremity supported, Reliant on assistive device for balance Standing balance-Leahy Scale: Poor                               Pertinent Vitals/Pain Pain Assessment Pain Assessment: No/denies pain    Home Living Family/patient expects to be discharged to:: Private residence Living Arrangements: Spouse/significant other Available Help at Discharge: Available 24 hours/day;Family Type of Home: [place]  Access: Stairs to enter Entrance Stairs-Rails: Left Entrance Stairs-Number of Steps: 3   Home Layout: Two level;Able to live on main level with bedroom/bathroom Home Equipment: Shower seat;Cane - single point Additional Comments: has borrowed a RW    Prior Function  Prior Level of Function : Independent/Modified Independent             Mobility Comments: Could ambulate community distances using a cane       Hand Dominance        Extremity/Trunk Assessment   Upper Extremity Assessment Upper Extremity Assessment: Overall WFL for tasks assessed    Lower Extremity Assessment Lower Extremity Assessment: RLE deficits/detail RLE Deficits / Details: Expected post op changes.  ROM WFL; MMT ankle 5/5, knee 3/5 not further test, hip 2/5    Cervical / Trunk Assessment Cervical / Trunk Assessment: Normal  Communication   Communication: No difficulties  Cognition Arousal/Alertness: Awake/alert Behavior During Therapy: WFL for tasks assessed/performed Overall Cognitive Status: Within Functional Limits for tasks assessed                                          General Comments General comments (skin integrity, edema, etc.): Pt reported feeling dizzy upon sitting and BP was 136/66.  Had pt sit a few mins before standing.  Stood and weight shifted and BP down to 105/92. Pt reporting feeling dizzy, "head feels funny" - denied syncopal symptoms.  Returned to sitting.  Had pt perform seated exercises , drank some water, and after at least 5 mins tried again.  BP was122/79 sitting.  Encouraged deep breaths, BP in standing 127/75.  Pt reports some symptoms but not as bad. Was able to ambulate but unsteady and report still feeling some dizziness. Returned to bed and encouraged pt to drink more water and PT would return.    Exercises General Exercises - Lower Extremity Ankle Circles/Pumps: AROM, Both, Seated, 20 reps Long Arc Quad: AROM, Both, 20 reps, Seated   Assessment/Plan    PT Assessment Patient needs continued PT services  PT Problem List Decreased strength;Decreased mobility;Decreased safety awareness;Decreased activity tolerance;Decreased balance;Decreased knowledge of use of DME;Cardiopulmonary status limiting activity;Decreased  range of motion       PT Treatment Interventions DME instruction;Therapeutic activities;Modalities;Gait training;Therapeutic exercise;Patient/family education;Stair training;Balance training;Functional mobility training    PT Goals (Current goals can be found in the Care Plan section)  Acute Rehab PT Goals Patient Stated Goal: return home PT Goal Formulation: With patient/family Time For Goal Achievement: 03/09/22 Potential to Achieve Goals: Good    Frequency 7X/week     Co-evaluation               AM-PAC PT "6 Clicks" Mobility  Outcome Measure Help needed turning from your back to your side while in a flat bed without using bedrails?: A Little Help needed moving from lying on your back to sitting on the side of a flat bed without using bedrails?: A Little Help needed moving to and from a bed to a chair (including a wheelchair)?: A Little Help needed standing up from a chair using your arms (e.g., wheelchair or bedside chair)?: A Little Help needed to walk in hospital room?: A Little Help needed climbing 3-5 steps with a railing? : A Lot 6 Click Score: 17    End of Session Equipment Utilized During Treatment: Gait belt Activity Tolerance: Treatment limited secondary to medical  complications (Comment) Patient left: in bed;with call bell/phone within reach Nurse Communication: Mobility status PT Visit Diagnosis: Other abnormalities of gait and mobility (R26.89);Muscle weakness (generalized) (M62.81)    Time: 3154-0086 PT Time Calculation (min) (ACUTE ONLY): 43 min   Charges:   PT Evaluation $PT Eval Moderate Complexity: 1 Mod PT Treatments $Gait Training: 8-22 mins $Therapeutic Activity: 8-22 mins        Abran Richard, PT Acute Rehab [facility] 02/23/2022, 1:53 PM

## 2022-02-23 NOTE — Anesthesia Procedure Notes (Signed)
Spinal  Patient location during procedure: OR Start time: 02/23/2022 7:17 AM Reason for block: surgical anesthesia Staffing Performed: resident/CRNA  Anesthesiologist: Nolon Nations, MD Resident/CRNA: Eben Burow, CRNA Performed by: Eben Burow, CRNA Authorized by: Nolon Nations, MD   Preanesthetic Checklist Completed: patient identified, IV checked, site marked, risks and benefits discussed, surgical consent, monitors and equipment checked, pre-op evaluation and timeout performed Spinal Block Patient position: sitting Prep: DuraPrep and site prepped and draped Patient monitoring: continuous pulse ox, blood pressure, cardiac monitor and heart rate Approach: midline Location: L3-4 Injection technique: single-shot Needle Needle type: Pencan  Needle gauge: 24 G Needle length: 10 cm Assessment Sensory level: T4 Events: CSF return Additional Notes Pt placed in sitting position, spinal kit expiration date checked and verified, + CSF, - heme, pt tolerated well. Dr Lissa Hoard present and supervising throughout SAB placement.

## 2022-02-23 NOTE — Op Note (Signed)
02/23/2022  9:16 AM  PATIENT:  Evan [name]   MRN: [id number]  PRE-OPERATIVE DIAGNOSIS: Right hip degenerative joint disease, avascular necrosis  POST-OPERATIVE DIAGNOSIS:  same  PROCEDURE:  Procedure(s): TOTAL HIP ARTHROPLASTY  PREOPERATIVE INDICATIONS:    Evan [name] is an 76 y.o. male who has a diagnosis of avascular necrosis with subchondral collapse right femoral head and elected for surgical management after failing conservative treatment.  The risks benefits and alternatives were discussed with the patient including but not limited to the risks of nonoperative treatment, versus surgical intervention including infection, bleeding, nerve injury, periprosthetic fracture, the need for revision surgery, dislocation, leg length discrepancy, blood clots, cardiopulmonary complications, morbidity, mortality, among others, and they were willing to proceed.     OPERATIVE REPORT     SURGEON:  Charlies Constable, MD    ASSISTANT: Izola Price, RNFA, (Present throughout the entire procedure,  necessary for completion of procedure in a timely manner, assisting with retraction, instrumentation, and closure)     ANESTHESIA: Spinal  ESTIMATED BLOOD LOSS: [id number]    COMPLICATIONS:  None.     COMPONENTS: Stryker Trident 2 acetabular shell 54 mm, Accolade 2 size 6 stem with 127 degree neck angle, 36+2.5 femoral head ceramic, neutral polyethylene liner Implant Name Type Inv. Item Serial No. Manufacturer Lot No. LRB No. Used Action  SHELL ACETABUL CLUSTER SZ 54 - [id number] Shell SHELL ACETABUL CLUSTER SZ [place] [id number] Right 1 Implanted  SCREW HEX LP 6.5X30 - [id number] Screw SCREW HEX LP 6.5X30  [employer] [id number] Right 1 Implanted  SCREW HEX LP 6.5X25 - [id number] Screw SCREW HEX LP 6.5X25  [employer] [id number] Right 1 Implanted  INSERT TRIDENT POLY 36 0DEG - [id number] Insert INSERT TRIDENT POLY 36 0DEG  [employer] [id number] Right 1 Implanted  STEM ACCOLADE SZ 6  - [id number] Hips STEM ACCOLADE SZ 6  [employer] [id number] Right 1 Implanted  HIP BIOLOX HD 36/+2.5 - [id number] Joint HIP BIOLOX HD 36/+2.5  [employer] [id number] Right 1 Implanted      PROCEDURE IN DETAIL:   The patient was met in the holding area and  identified.  The appropriate hip was identified and marked at the operative site.  The patient was then transported to the OR  and  placed under anesthesia.  At that point, the patient was  placed in the lateral decubitus position with the operative side up and  secured to the operating room table  and all bony prominences padded. A subaxillary role was also placed.    The operative lower extremity was prepped from the iliac crest to the distal leg.  Sterile draping was performed.  Time out was performed prior to incision.      A routine posterolateral approach was utilized via sharp dissection  carried down to the subcutaneous tissue.  Gross bleeders were Bovie coagulated.  The iliotibial band was identified and incised along the length of the skin incision through the glute max fascia.  Charnley retractor was placed with care to protect the sciatic nerve posteriorly.  With the hip internally rotated, the piriformis tendon was identified and released from the femoral insertion and tagged with a #5 Ethibond.  A capsulotomy was then performed off the femoral insertion and also tagged with a #5 Ethibond.    The femoral neck was exposed, and I resected the femoral neck based on preoperative templating relative to the lesser trochanter.    I then exposed the deep acetabulum, cleared out any  tissue including the ligamentum teres.  After adequate visualization, I excised the labrum.  I then started reaming with a 50 mm reamer, first medializing to the floor of the cotyloid fossa, and then in the position of the cup aiming towards the greater sciatic notch, matching the version of the transverse acetabular ligament and tucked under the anterior  wall. I reamed up to 54 mm reamer with good bony bed preparation and a 54 mm cup was chosen.  The real cup was then impacted into place.  Appropriate version and inclination was confirmed clinically matching their bony anatomy, and also with the use of the jig.  I placed 2 screws in the posterior superior quadrant to augment fixation.  A neutral liner was placed and impacted. It was confirmed to be appropriately seated and the acetabular retractors were removed.    I then prepared the proximal femur using the box cutter, Charnley awl, and then sequentially broached starting with 0 up to a size 5.  A trial broach, neck, and head was utilized, and I reduced the hip, and felt the distal measured slightly short, with impingement both posteriorly and anteriorly. Intra-Op flatplate x-ray was obtained which showed good position of components femoral component selected low bit undersized.  The broach had excellent rotational stability with a size 5 but not axial stability continue to subside.  Elected to broach up to a size 6.  With this the broach rested about 3 mm proud of the cut.  We again reduced the hip with a +0 ball and checked stability.  There was no impingement with full extension and 90 degrees external rotation.  The hip was stable at the position of sleep and with 90 degrees flexion and 60 degrees of internal rotation.  Then also trialed the +2.5 in her ball which had stability 90 degrees flexion and 75 degrees of internal rotation.  Leg lengths also appeared clinically equal.  A final femoral prosthesis size 6 was selected. I then impacted the real femoral prosthesis into place.I again trialed and selected a 36+ 2.5 mm ball. The hip was then reduced and taken through a range of motion. There was no impingement with full extension and 90 degrees external rotation.  The hip was stable at the position of sleep and with 90 degrees flexion and 75 degrees of internal rotation. Leg lengths were  again  assessed and felt to be restored.  We then opened, and I impacted the real head ball into place.  The posterior capsule was then closed with #5 Ethibond.  The piriformis was repaired through the base of the abductor tendon using a [place] suture passer.  I then irrigated the hip copiously again with pulse lavage. Periarticular injection was then performed with Exparel.  Intraop flat plate xray was obtained and components were confirmed to be in good position without fracture. We repaired the fascia #1 barbed suture, followed by 0 barbed suture for the subcutaneous fat.  Skin was closed with 2-0 Vicryl and 3-0 Monocryl.  Dermabond and Aquacel dressing were applied. The patient was then awakened and returned to PACU in stable and satisfactory condition.  Leg lengths in the supine position were assessed and felt to be clinically equal. There were no complications.  Post op recs: WB: WBAT RLE, No formal hip precautions Abx: ancef Imaging: PACU pelvis Xray Dressing: Aquacell, keep intact until follow up DVT prophylaxis: Aspirin 81BID starting POD1 Follow up: 2 weeks after surgery for a wound check with Dr. Zachery Dakins at [facility]  [employer].  Address: [address], [place], [place] [address]  Office Phone: [phone]   Charlies Constable, MD Orthopedic Surgeon

## 2022-02-23 NOTE — Anesthesia Procedure Notes (Signed)
Procedure Name: MAC Date/Time: 02/23/2022 7:11 AM  Performed by: Eben Burow, CRNAPre-anesthesia Checklist: Patient identified, Emergency Drugs available, Suction available, Patient being monitored and Timeout performed Oxygen Delivery Method: Simple face mask Placement Confirmation: positive ETCO2

## 2022-02-23 NOTE — Transfer of Care (Signed)
Immediate Anesthesia Transfer of Care Note  Patient: Evan [name]  Procedure(s) Performed: TOTAL HIP ARTHROPLASTY (Right: Hip)  Patient Location: PACU  Anesthesia Type:Spinal  Level of Consciousness: awake, alert  and patient cooperative  Airway & Oxygen Therapy: Patient Spontanous Breathing and Patient connected to face mask oxygen  Post-op Assessment: Report given to RN and Post -op Vital signs reviewed and stable  Post vital signs: Reviewed and stable  Last Vitals:  Vitals Value Taken Time  BP 108/61 02/23/22 0927  Temp    Pulse 70 02/23/22 0929  Resp 19 02/23/22 0929  SpO2 100 % 02/23/22 0929  Vitals shown include unvalidated device data.  Last Pain:  Vitals:   02/23/22 0600  TempSrc: Oral         Complications: No notable events documented.

## 2022-02-23 NOTE — Discharge Instructions (Signed)

## 2022-02-23 NOTE — Progress Notes (Signed)
Physical Therapy Treatment Patient Details Name: Evan [name] MRN: [id number] DOB: 02-09-1946 Today's Date: 02/23/2022   History of Present Illness Pt is 76 yo male s/p R posterior THA on 02/23/22.  No hip precautions per orders.  Pt with hx including but no limited to arthritis, HTN, vertigo.    PT Comments    Pt reports feeling much better.  He was able to ambulate and perform stairs safely.  He had no loss of balance and denied dizziness.  BP improved.    Pt demonstrates safe gait & transfers in order to return home from PT perspective once discharged by MD.  While in hospital, will continue to benefit from PT for skilled therapy to advance mobility and exercises.      Recommendations for follow up therapy are one component of a multi-disciplinary discharge planning process, led by the attending physician.  Recommendations may be updated based on patient status, additional functional criteria and insurance authorization.  Follow Up Recommendations  Follow physician's recommendations for discharge plan and follow up therapies     Assistance Recommended at Discharge Intermittent Supervision/Assistance  Patient can return home with the following Assistance with cooking/housework;Help with stairs or ramp for entrance   Equipment Recommendations  None recommended by PT    Recommendations for Other Services       Precautions / Restrictions Precautions Precautions: Fall Precaution Comments: No hip precautions Restrictions Weight Bearing Restrictions: Yes RLE Weight Bearing: Weight bearing as tolerated     Mobility  Bed Mobility Overal bed mobility: Needs Assistance Bed Mobility: Supine to Sit     Supine to sit: Min assist Sit to supine: Min assist   General bed mobility comments: Very light min A for R LE    Transfers Overall transfer level: Needs assistance Equipment used: Rolling walker (2 wheels) Transfers: Sit to/from Stand Sit to Stand: Supervision            General transfer comment: Performed from stretcher and toilet; steady; good hand placement    Ambulation/Gait Ambulation/Gait assistance: Supervision Gait Distance (Feet): 80 Feet Assistive device: Rolling walker (2 wheels) Gait Pattern/deviations: Step-through pattern, Decreased stride length Gait velocity: decreased     General Gait Details: Educated on RW proximity; performed well with steady gait   Stairs Stairs: Yes Stairs assistance: Min guard Stair Management: One rail Left, Step to pattern Number of Stairs: 5 General stair comments: Educated on sequencing and Environmental health practitioner    Modified Rankin (Stroke Patients Only)       Balance Overall balance assessment: Needs assistance Sitting-balance support: No upper extremity supported Sitting balance-Leahy Scale: Good     Standing balance support: Bilateral upper extremity supported, No upper extremity supported Standing balance-Leahy Scale: Fair Standing balance comment: RW to ambulate - steady; could static stand no AD                            Cognition Arousal/Alertness: Awake/alert Behavior During Therapy: WFL for tasks assessed/performed Overall Cognitive Status: Within Functional Limits for tasks assessed                                          Exercises Total Joint Exercises Ankle Circles/Pumps: AROM, Both, 5 reps, Seated Quad Sets: AROM, Right, 5 reps, Seated Long Arc Quad: AROM, Right, 5 reps, Seated General Exercises - Lower Extremity  Ankle Circles/Pumps: AROM, Both, Seated, 20 reps Long Arc Quad: AROM, Both, 20 reps, Seated    General Comments General comments (skin integrity, edema, etc.):   BP stable  Educated on safe ice use, no pivots, car transfers,. Also, encouraged walking every 1-2 hours during day. Educated on HEP with focus on mobility the first weeks. Discussed doing exercises within pain control and if pain increasing could decreased ROM,  reps, and stop exercises as needed. Encouraged to perform ankle pumps frequently for blood flow. Pt asking about posterior hip precautions - educated that no hip precautions per MD and new protocols/studies.       Pertinent Vitals/Pain Pain Assessment Pain Assessment: No/denies pain    Home Living Family/patient expects to be discharged to:: Private residence Living Arrangements: Spouse/significant other Available Help at Discharge: Available 24 hours/day;Family Type of Home: House Home Access: Stairs to enter Entrance Stairs-Rails: Left Entrance Stairs-Number of Steps: 3   Home Layout: Two level;Able to live on main level with bedroom/bathroom Home Equipment: Shower seat;Cane - single point Additional Comments: has borrowed a RW    Prior Function            PT Goals (current goals can now be found in the care plan section) Acute Rehab PT Goals Patient Stated Goal: return home PT Goal Formulation: With patient/family Time For Goal Achievement: 03/09/22 Potential to Achieve Goals: Good Progress towards PT goals: Progressing toward goals    Frequency    7X/week      PT Plan Current plan remains appropriate    Co-evaluation              AM-PAC PT "6 Clicks" Mobility   Outcome Measure  Help needed turning from your back to your side while in a flat bed without using bedrails?: A Little Help needed moving from lying on your back to sitting on the side of a flat bed without using bedrails?: A Little Help needed moving to and from a bed to a chair (including a wheelchair)?: A Little Help needed standing up from a chair using your arms (e.g., wheelchair or bedside chair)?: A Little Help needed to walk in hospital room?: A Little Help needed climbing 3-5 steps with a railing? : A Little 6 Click Score: 18    End of Session Equipment Utilized During Treatment: Gait belt Activity Tolerance: Patient tolerated treatment well Patient left: in bed;with call bell/phone  within reach;with family/visitor present Nurse Communication: Mobility status PT Visit Diagnosis: Other abnormalities of gait and mobility (R26.89);Muscle weakness (generalized) (M62.81)     Time: 1440-1506 PT Time Calculation (min) (ACUTE ONLY): 26 min  Charges:  $Gait Training: 8-22 mins $Therapeutic Activity: 8-22 mins                     Abran Richard, PT Acute Rehab [facility] [place] 02/23/2022, 3:12 PM

## 2022-02-23 NOTE — Interval H&P Note (Signed)
The patient has been re-examined, and the chart reviewed, and there have been no interval changes to the documented history and physical.    Plan for right total hip arthroplasty posterior approach.   The operative side was examined and the patient was confirmed to have sensation to DPN, SPN, TN intact, Motor EHL, ext, flex 5/5, and DP 2+, PT 2+, No significant edema.   The risks, benefits, and alternatives have been discussed at length with patient, and the patient is willing to proceed.  Right hip marked. Consent has been signed.

## 2022-02-24 ENCOUNTER — Encounter (HOSPITAL_COMMUNITY): Payer: Self-pay | Admitting: Orthopedic Surgery

## 2022-02-25 ENCOUNTER — Ambulatory Visit: Payer: Medicare Other | Attending: Orthopedic Surgery | Admitting: Physical Therapy

## 2022-02-25 ENCOUNTER — Encounter: Payer: Self-pay | Admitting: Physical Therapy

## 2022-02-25 DIAGNOSIS — R278 Other lack of coordination: Secondary | ICD-10-CM | POA: Diagnosis not present

## 2022-02-25 DIAGNOSIS — R600 Localized edema: Secondary | ICD-10-CM | POA: Diagnosis not present

## 2022-02-25 DIAGNOSIS — M25551 Pain in right hip: Secondary | ICD-10-CM | POA: Diagnosis not present

## 2022-02-25 DIAGNOSIS — R293 Abnormal posture: Secondary | ICD-10-CM

## 2022-02-25 DIAGNOSIS — R2681 Unsteadiness on feet: Secondary | ICD-10-CM

## 2022-02-25 DIAGNOSIS — M6281 Muscle weakness (generalized): Secondary | ICD-10-CM

## 2022-02-25 DIAGNOSIS — R262 Difficulty in walking, not elsewhere classified: Secondary | ICD-10-CM

## 2022-02-25 NOTE — Therapy (Unsigned)
OUTPATIENT PHYSICAL THERAPY LOWER EXTREMITY EVALUATION   Patient Name: Evan [name] MRN: [id number] DOB:March 26, 1946, 76 y.o., male Today's Date: 02/26/2022   PT End of Session - 02/25/22 1212     Visit Number 1    Date for PT Re-Evaluation 05/20/22    PT Start Time 1110    PT Stop Time 7858    PT Time Calculation (min) 35 min    Activity Tolerance Patient tolerated treatment well    Behavior During Therapy [facility] for tasks assessed/performed             Past Medical History:  Diagnosis Date   Aneurysm artery, renal ([place]) 07/13/2008   Surgery 2010 at [place]    Coronary artery disease 03/13/1996   LAD PCI '97, LAD/CFX DES 7/12   Hyperlipidemia    PCP follows   Hypertension    OSA on CPAP 07/13/2008   Mild OSA doesn't use CPAP   Pre-diabetes    Sleep apnea    Does not have CPAP   Vertigo    Past Surgical History:  Procedure Laterality Date   COLONOSCOPY     [place]    CORONARY STENT PLACEMENT  04/06/96 & 01/29/11   LAD PCI '97, LAD/CFX DES 7/12   DOPPLER ECHOCARDIOGRAPHY  03/09/2008   EF 50-55%   EYE SURGERY Bilateral    Cataract   NM MYOCAR PERF WALL MOTION  03/09/2008   subtle septal ischemia which decide to treat mediclly   NM MYOCAR PERF WALL MOTION  01/16/2011   showed mild to moderate septal ischemia   renal arterial aneursym  05/21/2009   surgerical resectd by Dr Kennith Gain at [place] Right 02/23/2022   Procedure: [place];  Surgeon: Willaim Sheng, MD;  Location: [facility];  Service: Orthopedics;  Laterality: Right;   WISDOM TOOTH EXTRACTION     Patient Active Problem List   Diagnosis Date Noted   Slow heart rate 06/11/2017   CAD - H/O PCI 03/30/2013   PVD  03/30/2013   HTN (hypertension) 03/30/2013   Dyslipidemia 03/30/2013    PCP: Merrilee Seashore  REFERRING PROVIDER: Willaim Sheng, MD   REFERRING DIAG: Diagnosis Description s/p Rt posterior total hip replacement 8/14    THERAPY DIAG:  Pain in right hip  Abnormal posture  Difficulty in walking, not elsewhere classified  Localized edema  Muscle weakness (generalized)  Other lack of coordination  Unsteadiness on feet  Rationale for Evaluation and Treatment Rehabilitation  ONSET DATE: 12/25/2021   SUBJECTIVE:   SUBJECTIVE STATEMENT: Patient reports that he felt better right after therapy than he does now. He arrived using a cane.  PERTINENT HISTORY: Pt is 76 yo male s/p R posterior THA on 02/23/22.  No hip precautions per orders.  Pt with hx including but no limited to arthritis, HTN, vertigo.   PAIN:  Are you having pain? Yes: NPRS scale: 3/10 Pain location: R hip Pain description: grabbing Aggravating factors: walking Relieving factors: rest, pain meds, gentle movement  PRECAUTIONS: None-Dr stated no Posterior hip precautions  WEIGHT BEARING RESTRICTIONS No  FALLS:  Has patient fallen in last 6 months? No  LIVING ENVIRONMENT: Lives with: lives with their family and lives with their spouse Lives in: House/apartment Stairs: Yes: External: 3 steps; on left going up Has following equipment at home: Single point cane, Walker - 2 wheeled, Crutches, shower chair, and elevated commode seat  OCCUPATION: reired  PLOF: Independent  PATIENT GOALS  Recover from hip surgery   OBJECTIVE:   COGNITION:  Overall cognitive status: Within functional limits for tasks assessed     SENSATION: Not tested  EDEMA:  Mild RLE edema  POSTURE: rounded shoulders and flexed trunk   PALPATION: Edematous and mildly TTP around knee on R.  LOWER EXTREMITY ROM:  Active ROM Right eval Left eval  Hip flexion WFL   Hip extension Neutral   Hip abduction [facility]   Hip adduction    Hip internal rotation    Hip external rotation    Knee flexion WFL   Knee extension 10   Ankle dorsiflexion WFL   Ankle plantarflexion    Ankle inversion    Ankle eversion     (Blank rows = not tested)  LOWER  EXTREMITY MMT:  MMT Right eval Left eval  Hip flexion 2 5  Hip extension 2+ 5  Hip abduction 3- 5  Hip adduction    Hip internal rotation    Hip external rotation    Knee flexion 4- 5  Knee extension 4- 5  Ankle dorsiflexion 4 5  Ankle plantarflexion    Ankle inversion    Ankle eversion     (Blank rows = not tested)  FUNCTIONAL TESTS:  5 times sit to stand: TBD Timed up and go (TUG): TBD  GAIT: Distance walked: 43' Assistive device utilized: Single point cane Level of assistance: Modified independence Comments: Patient     TODAY'S TREATMENT: Education, HEP including AP, HS, QS, GS, ABD, SAQ, Seated long kicks   PATIENT EDUCATION:  Education details: POC, HEP Person educated: Patient and Spouse Education method: Explanation, Media planner, and Handouts Education comprehension: verbalized understanding and returned demonstration   HOME EXERCISE PROGRAM: [id number]  ASSESSMENT:  CLINICAL IMPRESSION: Patient is a 76 y.o. who was seen today for physical therapy evaluation and treatment for R posterior hip replacement. He has no precautions, per his surgeon. He demosntrates weakness, decreased balance, decreased safety and I with all functional mobility, and pain, at appropriate levels for having surgery 2 days ago. He will benefit from PT to facilitate his return to his normal activities without AD and without pain or fatigue.   OBJECTIVE IMPAIRMENTS Abnormal gait, decreased activity tolerance, decreased balance, decreased coordination, decreased endurance, decreased mobility, difficulty walking, decreased ROM, decreased strength, increased muscle spasms, impaired flexibility, postural dysfunction, and pain.   ACTIVITY LIMITATIONS lifting, bending, sitting, standing, squatting, sleeping, stairs, transfers, bed mobility, and locomotion level  PARTICIPATION LIMITATIONS: meal prep, cleaning, laundry, driving, shopping, community activity, and yard work  PERSONAL FACTORS  Age are also affecting patient's functional outcome.   REHAB POTENTIAL: Good  CLINICAL DECISION MAKING: Stable/uncomplicated  EVALUATION COMPLEXITY: Low   GOALS: Goals reviewed with patient? Yes  SHORT TERM GOALS: Target date: 03/25/2022  I with initial HEP Baseline: Goal status: INITIAL  LONG TERM GOALS: Target date: 05/20/2022   I with final HEP Baseline:  Goal status: INITIAL  2.  Patient will achieve I ambulation x at least 800' with LRAD, on level and unlevel surfaces, no pain in R hip. Baseline:  Goal status: INITIAL  3.  Complete 5 x STS in < 12 seconds Baseline: TBD Goal status: INITIAL  4.  Complete TUG in < 12 sec Baseline: TBD Goal status: INITIAL  PLAN: PT FREQUENCY: 2x/week  PT DURATION: 12 weeks  PLANNED INTERVENTIONS: Therapeutic exercises, Therapeutic activity, Neuromuscular re-education, Balance training, Gait training, Patient/Family education, Self Care, Joint mobilization, Ionotophoresis '4mg'$ /ml Dexamethasone, and Manual therapy  PLAN FOR NEXT  SESSION: 5 x STS, TUG assessments, update HEP to review standing exercises.   Marcelina Morel, DPT 02/26/2022, 7:45 AM

## 2022-03-02 ENCOUNTER — Encounter: Payer: Medicare Other | Admitting: Physical Therapy

## 2022-03-04 ENCOUNTER — Ambulatory Visit: Payer: Medicare Other | Admitting: Physical Therapy

## 2022-03-04 ENCOUNTER — Encounter: Payer: Self-pay | Admitting: Physical Therapy

## 2022-03-04 DIAGNOSIS — R293 Abnormal posture: Secondary | ICD-10-CM | POA: Diagnosis not present

## 2022-03-04 DIAGNOSIS — M6281 Muscle weakness (generalized): Secondary | ICD-10-CM

## 2022-03-04 DIAGNOSIS — R600 Localized edema: Secondary | ICD-10-CM

## 2022-03-04 DIAGNOSIS — R262 Difficulty in walking, not elsewhere classified: Secondary | ICD-10-CM | POA: Diagnosis not present

## 2022-03-04 DIAGNOSIS — M25551 Pain in right hip: Secondary | ICD-10-CM

## 2022-03-04 DIAGNOSIS — R278 Other lack of coordination: Secondary | ICD-10-CM | POA: Diagnosis not present

## 2022-03-04 NOTE — Therapy (Signed)
OUTPATIENT PHYSICAL THERAPY LOWER EXTREMITY EVALUATION   Patient Name: Evan [name] MRN: [id number] DOB:11-28-1945, 76 y.o., male Today's Date: 03/04/2022   PT End of Session - 03/04/22 1101     Visit Number 2    Date for PT Re-Evaluation 05/20/22    PT Start Time 55    PT Stop Time 5400    PT Time Calculation (min) 45 min    Activity Tolerance Patient tolerated treatment well    Behavior During Therapy [facility] for tasks assessed/performed             Past Medical History:  Diagnosis Date   Aneurysm artery, renal ([place]) 07/13/2008   Surgery 2010 at [place]    Coronary artery disease 03/13/1996   LAD PCI '97, LAD/CFX DES 7/12   Hyperlipidemia    PCP follows   Hypertension    OSA on CPAP 07/13/2008   Mild OSA doesn't use CPAP   Pre-diabetes    Sleep apnea    Does not have CPAP   Vertigo    Past Surgical History:  Procedure Laterality Date   COLONOSCOPY     [place]    CORONARY STENT PLACEMENT  04/06/96 & 01/29/11   LAD PCI '97, LAD/CFX DES 7/12   DOPPLER ECHOCARDIOGRAPHY  03/09/2008   EF 50-55%   EYE SURGERY Bilateral    Cataract   NM MYOCAR PERF WALL MOTION  03/09/2008   subtle septal ischemia which decide to treat mediclly   NM MYOCAR PERF WALL MOTION  01/16/2011   showed mild to moderate septal ischemia   renal arterial aneursym  05/21/2009   surgerical resectd by Dr Kennith Gain at [place] Right 02/23/2022   Procedure: [place];  Surgeon: Willaim Sheng, MD;  Location: [facility];  Service: Orthopedics;  Laterality: Right;   WISDOM TOOTH EXTRACTION     Patient Active Problem List   Diagnosis Date Noted   Slow heart rate 06/11/2017   CAD - H/O PCI 03/30/2013   PVD  03/30/2013   HTN (hypertension) 03/30/2013   Dyslipidemia 03/30/2013    PCP: Merrilee Seashore  REFERRING PROVIDER: Willaim Sheng, MD   REFERRING DIAG: Diagnosis Description s/p Rt posterior total hip replacement 8/14    THERAPY DIAG:  Pain in right hip  Abnormal posture  Difficulty in walking, not elsewhere classified  Muscle weakness (generalized)  Localized edema  Rationale for Evaluation and Treatment Rehabilitation  ONSET DATE: 12/25/2021   SUBJECTIVE:   SUBJECTIVE STATEMENT: "Its better than two weeks ago"  PERTINENT HISTORY: Pt is 76 yo male s/p R posterior THA on 02/23/22.  No hip precautions per orders.  Pt with hx including but no limited to arthritis, HTN, vertigo.   PAIN:  Are you having pain? Yes: NPRS scale: 0/10 Pain location: R hip Pain description: grabbing Aggravating factors: walking Relieving factors: rest, pain meds, gentle movement  PRECAUTIONS: None-Dr stated no Posterior hip precautions  WEIGHT BEARING RESTRICTIONS No  FALLS:  Has patient fallen in last 6 months? No  LIVING ENVIRONMENT: Lives with: lives with their family and lives with their spouse Lives in: House/apartment Stairs: Yes: External: 3 steps; on left going up Has following equipment at home: Single point cane, Walker - 2 wheeled, Crutches, shower chair, and elevated commode seat  OCCUPATION: reired  PLOF: Independent  PATIENT GOALS Recover from hip surgery   OBJECTIVE:   EDEMA:  Mild RLE edema  POSTURE: rounded shoulders and flexed trunk  PALPATION: Edematous and mildly TTP around knee on R.  LOWER EXTREMITY ROM:  Active ROM Right eval Left eval  Hip flexion WFL   Hip extension Neutral   Hip abduction [facility]   Hip adduction    Hip internal rotation    Hip external rotation    Knee flexion WFL   Knee extension 10   Ankle dorsiflexion WFL   Ankle plantarflexion    Ankle inversion    Ankle eversion     (Blank rows = not tested)  LOWER EXTREMITY MMT:  MMT Right eval Left eval  Hip flexion 2 5  Hip extension 2+ 5  Hip abduction 3- 5  Hip adduction    Hip internal rotation    Hip external rotation    Knee flexion 4- 5  Knee extension 4- 5  Ankle dorsiflexion 4  5  Ankle plantarflexion    Ankle inversion    Ankle eversion     (Blank rows = not tested)  FUNCTIONAL TESTS:  5 times sit to stand: TBD Timed up and go (TUG): TBD  GAIT: Distance walked: 25' Assistive device utilized: Single point cane Level of assistance: Modified independence Comments: Patient     TODAY'S TREATMENT: 03/04/22 NuStep L5 x6 min no UE  S2S 2x10  Resisted gait 30lb 4 way x 3 each Forward step up 6in x 10 each Lateral Step ups 6in x 10 each Hamstring curls 25lb 2x10  Leg Extensions 10lb 2x10 Leg press 20lb 2x10    Education, HEP including AP, HS, QS, GS, ABD, SAQ, Seated long kicks   PATIENT EDUCATION:  Education details: POC, HEP Person educated: Patient and Spouse Education method: Consulting civil engineer, Media planner, and Handouts Education comprehension: verbalized understanding and returned demonstration   HOME EXERCISE PROGRAM: [id number]  ASSESSMENT:  CLINICAL IMPRESSION Pt tolerated an initial progression to TE well evident by no subjective report of increase pain. Pt can be impulsive at ties with interventions requiring constant cues to complete exercises slow and to control the eccentric phase. Cue needed to keep hips square with resisted side steps. Some weakness and instability present with step up interventions.   OBJECTIVE IMPAIRMENTS Abnormal gait, decreased activity tolerance, decreased balance, decreased coordination, decreased endurance, decreased mobility, difficulty walking, decreased ROM, decreased strength, increased muscle spasms, impaired flexibility, postural dysfunction, and pain.   ACTIVITY LIMITATIONS lifting, bending, sitting, standing, squatting, sleeping, stairs, transfers, bed mobility, and locomotion level  PARTICIPATION LIMITATIONS: meal prep, cleaning, laundry, driving, shopping, community activity, and yard work  PERSONAL FACTORS Age are also affecting patient's functional outcome.   REHAB POTENTIAL: Good  CLINICAL  DECISION MAKING: Stable/uncomplicated  EVALUATION COMPLEXITY: Low   GOALS: Goals reviewed with patient? Yes  SHORT TERM GOALS: Target date: 03/25/2022  I with initial HEP Baseline: Goal status: progressing  LONG TERM GOALS: Target date: 05/20/2022   I with final HEP Baseline:  Goal status: INITIAL  2.  Patient will achieve I ambulation x at least 800' with LRAD, on level and unlevel surfaces, no pain in R hip. Baseline:  Goal status: INITIAL  3.  Complete 5 x STS in < 12 seconds Baseline: TBD Goal status: INITIAL  4.  Complete TUG in < 12 sec Baseline: TBD Goal status: INITIAL  PLAN: PT FREQUENCY: 2x/week  PT DURATION: 12 weeks  PLANNED INTERVENTIONS: Therapeutic exercises, Therapeutic activity, Neuromuscular re-education, Balance training, Gait training, Patient/Family education, Self Care, Joint mobilization, Ionotophoresis '4mg'$ /ml Dexamethasone, and Manual therapy  PLAN FOR NEXT SESSION: 5 x STS, TUG assessments, update HEP  to review standing exercises.   Marcelina Morel, DPT 03/04/2022, 11:02 AM

## 2022-03-06 ENCOUNTER — Ambulatory Visit: Payer: Medicare Other | Admitting: Physical Therapy

## 2022-03-06 DIAGNOSIS — R262 Difficulty in walking, not elsewhere classified: Secondary | ICD-10-CM

## 2022-03-06 DIAGNOSIS — M6281 Muscle weakness (generalized): Secondary | ICD-10-CM | POA: Diagnosis not present

## 2022-03-06 DIAGNOSIS — R600 Localized edema: Secondary | ICD-10-CM | POA: Diagnosis not present

## 2022-03-06 DIAGNOSIS — M25551 Pain in right hip: Secondary | ICD-10-CM

## 2022-03-06 DIAGNOSIS — R293 Abnormal posture: Secondary | ICD-10-CM | POA: Diagnosis not present

## 2022-03-06 DIAGNOSIS — R278 Other lack of coordination: Secondary | ICD-10-CM | POA: Diagnosis not present

## 2022-03-06 NOTE — Therapy (Signed)
OUTPATIENT PHYSICAL THERAPY LOWER EXTREMITY   Patient Name: Evan [name] MRN: [id number] DOB:1945-09-09, 76 y.o., male Today's Date: 03/06/2022   PT End of Session - 03/06/22 1015     Visit Number 3    Date for PT Re-Evaluation 05/20/22    PT Start Time 1015    PT Stop Time 1100    PT Time Calculation (min) 45 min             Past Medical History:  Diagnosis Date   Aneurysm artery, renal ([place]) 07/13/2008   Surgery 2010 at [place]    Coronary artery disease 03/13/1996   LAD PCI '97, LAD/CFX DES 7/12   Hyperlipidemia    PCP follows   Hypertension    OSA on CPAP 07/13/2008   Mild OSA doesn't use CPAP   Pre-diabetes    Sleep apnea    Does not have CPAP   Vertigo    Past Surgical History:  Procedure Laterality Date   COLONOSCOPY     [place]    CORONARY STENT PLACEMENT  04/06/96 & 01/29/11   LAD PCI '97, LAD/CFX DES 7/12   DOPPLER ECHOCARDIOGRAPHY  03/09/2008   EF 50-55%   EYE SURGERY Bilateral    Cataract   NM MYOCAR PERF WALL MOTION  03/09/2008   subtle septal ischemia which decide to treat mediclly   NM MYOCAR PERF WALL MOTION  01/16/2011   showed mild to moderate septal ischemia   renal arterial aneursym  05/21/2009   surgerical resectd by Dr Kennith Gain at [place] Right 02/23/2022   Procedure: [place];  Surgeon: Willaim Sheng, MD;  [facility];  Service: Orthopedics;  Laterality: Right;   WISDOM TOOTH EXTRACTION     Patient Active Problem List   Diagnosis Date Noted   Slow heart rate 06/11/2017   CAD - H/O PCI 03/30/2013   PVD  03/30/2013   HTN (hypertension) 03/30/2013   Dyslipidemia 03/30/2013    PCP: Merrilee Seashore  REFERRING PROVIDER: Willaim Sheng, MD   REFERRING DIAG: Diagnosis Description s/p Rt posterior total hip replacement 8/14   THERAPY DIAG:  Pain in right hip  Difficulty in walking, not elsewhere classified  Muscle weakness  (generalized)  Rationale for Evaluation and Treatment Rehabilitation  ONSET DATE: 12/25/2021   SUBJECTIVE:   SUBJECTIVE STATEMENT: Very sore after last session-maybe I pushed and went too much, too fast  PERTINENT HISTORY: Pt is 76 yo male s/p R posterior THA on 02/23/22.  No hip precautions per orders.  Pt with hx including but no limited to arthritis, HTN, vertigo.   PAIN:  Are you having pain? Yes: NPRS scale: 2/10 Pain location: R hip Pain description: grabbing Aggravating factors: walking Relieving factors: rest, pain meds, gentle movement  PRECAUTIONS: None-Dr stated no Posterior hip precautions  WEIGHT BEARING RESTRICTIONS No  FALLS:  Has patient fallen in last 6 months? No  LIVING ENVIRONMENT: Lives with: lives with their family and lives with their spouse Lives in: House/apartment Stairs: Yes: External: 3 steps; on left going up Has following equipment at home: Single point cane, Walker - 2 wheeled, Crutches, shower chair, and elevated commode seat  OCCUPATION: reired  PLOF: Independent  PATIENT GOALS Recover from hip surgery   OBJECTIVE:   EDEMA:  Mild RLE edema  POSTURE: rounded shoulders and flexed trunk   PALPATION: Edematous and mildly TTP around knee on R.  LOWER EXTREMITY ROM:  Active ROM Right  eval Left eval  Hip flexion WFL   Hip extension Neutral   Hip abduction [facility]   Hip adduction    Hip internal rotation    Hip external rotation    Knee flexion WFL   Knee extension 10   Ankle dorsiflexion WFL   Ankle plantarflexion    Ankle inversion    Ankle eversion     (Blank rows = not tested)  LOWER EXTREMITY MMT:  MMT Right eval Left eval  Hip flexion 2 5  Hip extension 2+ 5  Hip abduction 3- 5  Hip adduction    Hip internal rotation    Hip external rotation    Knee flexion 4- 5  Knee extension 4- 5  Ankle dorsiflexion 4 5  Ankle plantarflexion    Ankle inversion    Ankle eversion     (Blank rows = not  tested)  FUNCTIONAL TESTS:  5 times sit to stand: TBD Timed up and go (TUG): TBD  GAIT: Distance walked: 33' Assistive device utilized: Single point cane Level of assistance: Modified independence Comments: Patient     TODAY'S TREATMENT:  03/06/22  Nustep L 5 5 min LE only Resisted gait 5 x fwd, back and laterally 30# 5 x STS  9.71 sec TUG 9.46 sec 6 inch step up with opp leg ext 1 Hand Hold 10 x BIL 6 inch step up with opp leg abd 1 HH 10 x BIL  Knee ext 10# 2 ses 10 HS curl 25# 2 sets 10 Leg press 30# 2 sets 10 Black bar DF and PF 15 x each Standing red tband hip 3 way BIL 15 x with UE support Add ball squeeze 15 x 3 sec     03/04/22 NuStep L5 x6 min no UE  S2S 2x10  Resisted gait 30lb 4 way x 3 each Forward step up 6in x 10 each Lateral Step ups 6in x 10 each Hamstring curls 25lb 2x10  Leg Extensions 10lb 2x10 Leg press 20lb 2x10    Education, HEP including AP, HS, QS, GS, ABD, SAQ, Seated long kicks   PATIENT EDUCATION:  Education details: POC, HEP Person educated: Patient and Spouse Education method: Consulting civil engineer, Media planner, and Handouts Education comprehension: verbalized understanding and returned demonstration   HOME EXERCISE PROGRAM: [id number]  ASSESSMENT:  CLINICAL IMPRESSION pt arrived stating sore from last session and verb he probably went to hard too fast, explianed slow and controlled is important. Pt tolerated increased ex interventions and progress with better control and speed. STS and TUG tested and WNLS  OBJECTIVE IMPAIRMENTS Abnormal gait, decreased activity tolerance, decreased balance, decreased coordination, decreased endurance, decreased mobility, difficulty walking, decreased ROM, decreased strength, increased muscle spasms, impaired flexibility, postural dysfunction, and pain.   ACTIVITY LIMITATIONS lifting, bending, sitting, standing, squatting, sleeping, stairs, transfers, bed mobility, and locomotion level  PARTICIPATION  LIMITATIONS: meal prep, cleaning, laundry, driving, shopping, community activity, and yard work  PERSONAL FACTORS Age are also affecting patient's functional outcome.   REHAB POTENTIAL: Good  CLINICAL DECISION MAKING: Stable/uncomplicated  EVALUATION COMPLEXITY: Low   GOALS: Goals reviewed with patient? Yes  SHORT TERM GOALS: Target date: 03/25/2022  I with initial HEP Baseline: Goal status: progressing  LONG TERM GOALS: Target date: 05/20/2022   I with final HEP Baseline:  Goal status: INITIAL  2.  Patient will achieve I ambulation x at least 800' with LRAD, on level and unlevel surfaces, no pain in R hip. Baseline:  Goal status: INITIAL  3.  Complete 5 x  STS in < 12 seconds Baseline: TBD Goal status: INITIAL  4.  Complete TUG in < 12 sec Baseline: TBD Goal status: INITIAL  PLAN: PT FREQUENCY: 2x/week  PT DURATION: 12 weeks  PLANNED INTERVENTIONS: Therapeutic exercises, Therapeutic activity, Neuromuscular re-education, Balance training, Gait training, Patient/Family education, Self Care, Joint mobilization, Ionotophoresis '4mg'$ /ml Dexamethasone, and Manual therapy  PLAN FOR NEXT SESSION: progress standing exercise HEP   Angie Abrianna Sidman PTA 03/06/2022, 10:15 AM [address] [place], [place], [address] Phone: [phone]   Fax:  [phone]  Patient Details  Name: Evan [name] MRN: [id number] Date of Birth: 1946-05-26 Referring Provider:  Merrilee Seashore, MD  Encounter Date: 03/06/2022   Laqueta Carina, PTA 03/06/2022, 10:15 AM  [address] [place], [place], [address] Phone: [phone]   Fax:  [phone]

## 2022-03-09 ENCOUNTER — Encounter: Payer: Self-pay | Admitting: Physical Therapy

## 2022-03-09 ENCOUNTER — Ambulatory Visit: Payer: Medicare Other | Admitting: Physical Therapy

## 2022-03-09 DIAGNOSIS — R2681 Unsteadiness on feet: Secondary | ICD-10-CM

## 2022-03-09 DIAGNOSIS — M25551 Pain in right hip: Secondary | ICD-10-CM | POA: Diagnosis not present

## 2022-03-09 DIAGNOSIS — M6281 Muscle weakness (generalized): Secondary | ICD-10-CM

## 2022-03-09 DIAGNOSIS — R293 Abnormal posture: Secondary | ICD-10-CM | POA: Diagnosis not present

## 2022-03-09 DIAGNOSIS — R262 Difficulty in walking, not elsewhere classified: Secondary | ICD-10-CM | POA: Diagnosis not present

## 2022-03-09 DIAGNOSIS — M1611 Unilateral primary osteoarthritis, right hip: Secondary | ICD-10-CM | POA: Diagnosis not present

## 2022-03-09 DIAGNOSIS — R278 Other lack of coordination: Secondary | ICD-10-CM | POA: Diagnosis not present

## 2022-03-09 DIAGNOSIS — R600 Localized edema: Secondary | ICD-10-CM | POA: Diagnosis not present

## 2022-03-09 NOTE — Therapy (Signed)
OUTPATIENT PHYSICAL THERAPY LOWER EXTREMITY   Patient Name: Evan [name] MRN: [id number] DOB:11-26-1945, 76 y.o., male Today's Date: 03/09/2022   PT End of Session - 03/09/22 1103     Visit Number 4    Date for PT Re-Evaluation 05/20/22    PT Start Time 1101    PT Stop Time 1140    PT Time Calculation (min) 39 min    Activity Tolerance Patient tolerated treatment well    Behavior During Therapy [facility] for tasks assessed/performed              Past Medical History:  Diagnosis Date   Aneurysm artery, renal ([place]) 07/13/2008   Surgery 2010 at [place]    Coronary artery disease 03/13/1996   LAD PCI '97, LAD/CFX DES 7/12   Hyperlipidemia    PCP follows   Hypertension    OSA on CPAP 07/13/2008   Mild OSA doesn't use CPAP   Pre-diabetes    Sleep apnea    Does not have CPAP   Vertigo    Past Surgical History:  Procedure Laterality Date   COLONOSCOPY     [place]    CORONARY STENT PLACEMENT  04/06/96 & 01/29/11   LAD PCI '97, LAD/CFX DES 7/12   DOPPLER ECHOCARDIOGRAPHY  03/09/2008   EF 50-55%   EYE SURGERY Bilateral    Cataract   NM MYOCAR PERF WALL MOTION  03/09/2008   subtle septal ischemia which decide to treat mediclly   NM MYOCAR PERF WALL MOTION  01/16/2011   showed mild to moderate septal ischemia   renal arterial aneursym  05/21/2009   surgerical resectd by Dr Kennith Gain at [place] Right 02/23/2022   Procedure: [place];  Surgeon: Willaim Sheng, MD;  [facility];  Service: Orthopedics;  Laterality: Right;   WISDOM TOOTH EXTRACTION     Patient Active Problem List   Diagnosis Date Noted   Slow heart rate 06/11/2017   CAD - H/O PCI 03/30/2013   PVD  03/30/2013   HTN (hypertension) 03/30/2013   Dyslipidemia 03/30/2013    PCP: Merrilee Seashore  REFERRING PROVIDER: Willaim Sheng, MD   REFERRING DIAG: Diagnosis Description s/p Rt posterior total hip replacement 8/14    THERAPY DIAG:  Pain in right hip  Difficulty in walking, not elsewhere classified  Muscle weakness (generalized)  Abnormal posture  Unsteadiness on feet  Other lack of coordination  Localized edema  Rationale for Evaluation and Treatment Rehabilitation  ONSET DATE: 12/25/2021   SUBJECTIVE:   SUBJECTIVE STATEMENT: Very sore after last session-maybe I pushed and went too much, too fast  PERTINENT HISTORY: Pt is 76 yo male s/p R posterior THA on 02/23/22.  No hip precautions per orders.  Pt with hx including but no limited to arthritis, HTN, vertigo.   PAIN:  Are you having pain? Yes: NPRS scale: 2/10 Pain location: R hip Pain description: grabbing Aggravating factors: walking Relieving factors: rest, pain meds, gentle movement  PRECAUTIONS: None-Dr stated no Posterior hip precautions  WEIGHT BEARING RESTRICTIONS No  FALLS:  Has patient fallen in last 6 months? No  LIVING ENVIRONMENT: Lives with: lives with their family and lives with their spouse Lives in: House/apartment Stairs: Yes: External: 3 steps; on left going up Has following equipment at home: Single point cane, Walker - 2 wheeled, Crutches, shower chair, and elevated commode seat  OCCUPATION: reired  PLOF: Independent  PATIENT GOALS Recover from hip surgery  OBJECTIVE:   EDEMA:  Mild RLE edema  POSTURE: rounded shoulders and flexed trunk   PALPATION: Edematous and mildly TTP around knee on R.  LOWER EXTREMITY ROM:  Active ROM Right eval Left eval  Hip flexion WFL   Hip extension Neutral   Hip abduction [facility]   Hip adduction    Hip internal rotation    Hip external rotation    Knee flexion WFL   Knee extension 10   Ankle dorsiflexion WFL   Ankle plantarflexion    Ankle inversion    Ankle eversion     (Blank rows = not tested)  LOWER EXTREMITY MMT:  MMT Right eval Left eval  Hip flexion 2 5  Hip extension 2+ 5  Hip abduction 3- 5  Hip adduction    Hip internal  rotation    Hip external rotation    Knee flexion 4- 5  Knee extension 4- 5  Ankle dorsiflexion 4 5  Ankle plantarflexion    Ankle inversion    Ankle eversion     (Blank rows = not tested)  FUNCTIONAL TESTS:  5 times sit to stand: TBD Timed up and go (TUG): TBD  GAIT: Distance walked: 31' Assistive device utilized: Single point cane Level of assistance: Modified independence Comments: Patient     TODAY'S TREATMENT: 03/09/22 Bike L4 x 6 minutes with 2 x 20 sec fast pedal. Sidelying hip abd with forward and back swing x 5, good control. Standing side to side steps for sped of movement, then forward back, 30 sec each. Step ups on step, forward, side step, crossover step-patient instructed to avoid any straining with crossover steps. 10 reps each with each leg SLS at counter, max 5 sec on R, 10 on L Step ups onto Airex pad in runners stride for balance, 10 reps each leg.   03/06/22 Nustep L 5 5 min LE only Resisted gait 5 x fwd, back and laterally 30# 5 x STS  9.71 sec TUG 9.46 sec 6 inch step up with opp leg ext 1 Hand Hold 10 x BIL 6 inch step up with opp leg abd 1 HH 10 x BIL  Knee ext 10# 2 ses 10 HS curl 25# 2 sets 10 Leg press 30# 2 sets 10 Black bar DF and PF 15 x each Standing red tband hip 3 way BIL 15 x with UE support Add ball squeeze 15 x 3 sec     03/04/22 NuStep L5 x6 min no UE  S2S 2x10  Resisted gait 30lb 4 way x 3 each Forward step up 6in x 10 each Lateral Step ups 6in x 10 each Hamstring curls 25lb 2x10  Leg Extensions 10lb 2x10 Leg press 20lb 2x10    Education, HEP including AP, HS, QS, GS, ABD, SAQ, Seated long kicks   PATIENT EDUCATION:  Education details: POC, HEP Person educated: Patient and Spouse Education method: Explanation, Media planner, and Handouts Education comprehension: verbalized understanding and returned demonstration   HOME EXERCISE PROGRAM: [id number]  ASSESSMENT:  CLINICAL IMPRESSION  Patient reports no issues.  He is progressing well toward LTG. He does experiencing a popping in his lateral hip with step ups. Has a Dr appointment today and will ask about it, but it does not hurt. HEP updated as he has progressed past most of the supine exercises.  OBJECTIVE IMPAIRMENTS Abnormal gait, decreased activity tolerance, decreased balance, decreased coordination, decreased endurance, decreased mobility, difficulty walking, decreased ROM, decreased strength, increased muscle spasms, impaired flexibility, postural dysfunction, and pain.  ACTIVITY LIMITATIONS lifting, bending, sitting, standing, squatting, sleeping, stairs, transfers, bed mobility, and locomotion level  PARTICIPATION LIMITATIONS: meal prep, cleaning, laundry, driving, shopping, community activity, and yard work  PERSONAL FACTORS Age are also affecting patient's functional outcome.   REHAB POTENTIAL: Good  CLINICAL DECISION MAKING: Stable/uncomplicated  EVALUATION COMPLEXITY: Low   GOALS: Goals reviewed with patient? Yes  SHORT TERM GOALS: Target date: 03/25/2022  I with initial HEP Baseline: Goal status: met  LONG TERM GOALS: Target date: 05/20/2022   I with final HEP Baseline:  Goal status: ongoing  2.  Patient will achieve I ambulation x at least 800' with LRAD, on level and unlevel surfaces, no pain in R hip. Baseline:  Goal status: ongoing  3.  Complete 5 x STS in < 12 seconds Baseline: TBD Goal status: INITIAL  4.  Complete TUG in < 12 sec Baseline: TBD Goal status: INITIAL  PLAN: PT FREQUENCY: 2x/week  PT DURATION: 12 weeks  PLANNED INTERVENTIONS: Therapeutic exercises, Therapeutic activity, Neuromuscular re-education, Balance training, Gait training, Patient/Family education, Self Care, Joint mobilization, Ionotophoresis 34m/ml Dexamethasone, and Manual therapy  PLAN FOR NEXT SESSION: progress standing exercise HEP   Angie Payseur PTA 03/09/2022, 11:45 AM [place] [address]Phone: [phone]  Fax:  [phone] Patient Details  Name: Evan NAKANISHIMRN: [id number] of Birth: [birth year]Referring Provider:  RMerrilee Seashore MD  Encounter Date: 03/09/2022   SMarcelina Morel DPT 03/09/2022, 11:45 AM  [place] [address]Phone: [phone]  Fax:  [phone]

## 2022-03-11 ENCOUNTER — Encounter: Payer: Self-pay | Admitting: Physical Therapy

## 2022-03-11 ENCOUNTER — Ambulatory Visit: Payer: Medicare Other | Admitting: Physical Therapy

## 2022-03-11 DIAGNOSIS — M6281 Muscle weakness (generalized): Secondary | ICD-10-CM

## 2022-03-11 DIAGNOSIS — R262 Difficulty in walking, not elsewhere classified: Secondary | ICD-10-CM | POA: Diagnosis not present

## 2022-03-11 DIAGNOSIS — R293 Abnormal posture: Secondary | ICD-10-CM | POA: Diagnosis not present

## 2022-03-11 DIAGNOSIS — R278 Other lack of coordination: Secondary | ICD-10-CM | POA: Diagnosis not present

## 2022-03-11 DIAGNOSIS — M25551 Pain in right hip: Secondary | ICD-10-CM

## 2022-03-11 DIAGNOSIS — R600 Localized edema: Secondary | ICD-10-CM | POA: Diagnosis not present

## 2022-03-11 NOTE — Therapy (Signed)
OUTPATIENT PHYSICAL THERAPY LOWER EXTREMITY   Patient Name: Evan [name] MRN: [id number] DOB:1946-05-08, 76 y.o., male Today's Date: 03/11/2022   PT End of Session - 03/11/22 1105     Visit Number 5    Date for PT Re-Evaluation 05/20/22    PT Start Time 1103    PT Stop Time 6433    PT Time Calculation (min) 42 min    Activity Tolerance Patient tolerated treatment well    Behavior During Therapy [facility] for tasks assessed/performed              Past Medical History:  Diagnosis Date   Aneurysm artery, renal ([place]) 07/13/2008   Surgery 2010 at [place]    Coronary artery disease 03/13/1996   LAD PCI '97, LAD/CFX DES 7/12   Hyperlipidemia    PCP follows   Hypertension    OSA on CPAP 07/13/2008   Mild OSA doesn't use CPAP   Pre-diabetes    Sleep apnea    Does not have CPAP   Vertigo    Past Surgical History:  Procedure Laterality Date   COLONOSCOPY     [place]    CORONARY STENT PLACEMENT  04/06/96 & 01/29/11   LAD PCI '97, LAD/CFX DES 7/12   DOPPLER ECHOCARDIOGRAPHY  03/09/2008   EF 50-55%   EYE SURGERY Bilateral    Cataract   NM MYOCAR PERF WALL MOTION  03/09/2008   subtle septal ischemia which decide to treat mediclly   NM MYOCAR PERF WALL MOTION  01/16/2011   showed mild to moderate septal ischemia   renal arterial aneursym  05/21/2009   surgerical resectd by Dr Kennith Gain at [place] Right 02/23/2022   Procedure: [place];  Surgeon: Willaim Sheng, MD;  Location: [facility];  Service: Orthopedics;  Laterality: Right;   WISDOM TOOTH EXTRACTION     Patient Active Problem List   Diagnosis Date Noted   Slow heart rate 06/11/2017   CAD - H/O PCI 03/30/2013   PVD  03/30/2013   HTN (hypertension) 03/30/2013   Dyslipidemia 03/30/2013    PCP: Merrilee Seashore  REFERRING PROVIDER: Willaim Sheng, MD   REFERRING DIAG: Diagnosis Description s/p Rt posterior total hip replacement 8/14    THERAPY DIAG:  Pain in right hip  Difficulty in walking, not elsewhere classified  Muscle weakness (generalized)  Abnormal posture  Rationale for Evaluation and Treatment Rehabilitation  ONSET DATE: 12/25/2021   SUBJECTIVE:   SUBJECTIVE STATEMENT: Very sore after last session-maybe I pushed and went too much, too fast  PERTINENT HISTORY: Feeling great, "Doctor told me to slow down" PAIN:  Are you having pain? Yes: NPRS scale: 2/10 Pain location: R hip Pain description: grabbing Aggravating factors: walking Relieving factors: rest, pain meds, gentle movement  PRECAUTIONS: None-Dr stated no Posterior hip precautions  WEIGHT BEARING RESTRICTIONS No  FALLS:  Has patient fallen in last 6 months? No  LIVING ENVIRONMENT: Lives with: lives with their family and lives with their spouse Lives in: House/apartment Stairs: Yes: External: 3 steps; on left going up Has following equipment at home: Single point cane, Walker - 2 wheeled, Crutches, shower chair, and elevated commode seat  OCCUPATION: reired  PLOF: Independent  PATIENT GOALS Recover from hip surgery   OBJECTIVE:   EDEMA:  Mild RLE edema  POSTURE: rounded shoulders and flexed trunk   PALPATION: Edematous and mildly TTP around knee on R.  LOWER EXTREMITY ROM:  Active ROM Right  eval Left eval  Hip flexion WFL   Hip extension Neutral   Hip abduction [facility]   Hip adduction    Hip internal rotation    Hip external rotation    Knee flexion WFL   Knee extension 10   Ankle dorsiflexion WFL   Ankle plantarflexion    Ankle inversion    Ankle eversion     (Blank rows = not tested)  LOWER EXTREMITY MMT:  MMT Right eval Left eval  Hip flexion 2 5  Hip extension 2+ 5  Hip abduction 3- 5  Hip adduction    Hip internal rotation    Hip external rotation    Knee flexion 4- 5  Knee extension 4- 5  Ankle dorsiflexion 4 5  Ankle plantarflexion    Ankle inversion    Ankle eversion     (Blank rows =  not tested)  FUNCTIONAL TESTS:  5 times sit to stand: TBD Timed up and go (TUG): TBD  GAIT: Distance walked: 29' Assistive device utilized: Single point cane Level of assistance: Modified independence Comments: Patient     TODAY'S TREATMENT: 03/11/22 NuStep L5 x5 min LE only Recumbent bike L 4 x4 min Resisted side steps 30lb 2x10 Hamstring curls 25lb 2x15 Leg Ext 10lb 2x15 Gait with SPC 130f 6in lateral step up x10 each 6in step ups x10 each S2S 2x15 Leg press RLE 20lb x10, Bilateral 40lb x10, x15    03/09/22 Bike L4 x 6 minutes with 2 x 20 sec fast pedal. Sidelying hip abd with forward and back swing x 5, good control. Standing side to side steps for sped of movement, then forward back, 30 sec each. Step ups o8n step, forward, side step, crossover step-patient instructed to avoid any straining with crossover steps. 10 reps each with each leg SLS at counter, max 5 sec on R, 10 on L Step ups onto Airex pad in runners stride for balance, 10 reps each leg.   03/06/22 Nustep L 5 5 min LE only Resisted gait 5 x fwd, back and laterally 30# 5 x STS  9.71 sec TUG 9.46 sec 6 inch step up with opp leg ext 1 Hand Hold 10 x BIL 6 inch step up with opp leg abd 1 HH 10 x BIL  Knee ext 10# 2 ses 10 HS curl 25# 2 sets 10 Leg press 30# 2 sets 10 Black bar DF and PF 15 x each Standing red tband hip 3 way BIL 15 x with UE support Add ball squeeze 15 x 3 sec     03/04/22 NuStep L5 x6 min no UE  S2S 2x10  Resisted gait 30lb 4 way x 3 each Forward step up 6in x 10 each Lateral Step ups 6in x 10 each Hamstring curls 25lb 2x10  Leg Extensions 10lb 2x10 Leg press 20lb 2x10    Education, HEP including AP, HS, QS, GS, ABD, SAQ, Seated long kicks   PATIENT EDUCATION:  Education details: POC, HEP Person educated: Patient and Spouse Education method: Explanation, DMedia planner and Handouts Education comprehension: verbalized understanding and returned demonstration   HOME  EXERCISE PROGRAM: [email or web address]  ASSESSMENT:  CLINICAL IMPRESSION  Patient reports no issues. He is progressing well toward LTG. PT enters using SPC on the wrong side, gait trial focus on pt using AD properly. Pt stated MD tole him to slow down. Pt would often do more resp that therapist requested with the interventions despite cues. RLE weakness noted with step ups and SL leg press.  OBJECTIVE IMPAIRMENTS Abnormal gait, decreased activity tolerance, decreased balance, decreased coordination, decreased endurance, decreased mobility, difficulty walking, decreased ROM, decreased strength, increased muscle spasms, impaired flexibility, postural dysfunction, and pain.   ACTIVITY LIMITATIONS lifting, bending, sitting, standing, squatting, sleeping, stairs, transfers, bed mobility, and locomotion level  PARTICIPATION LIMITATIONS: meal prep, cleaning, laundry, driving, shopping, community activity, and yard work  PERSONAL FACTORS Age are also affecting patient's functional outcome.   REHAB POTENTIAL: Good  CLINICAL DECISION MAKING: Stable/uncomplicated  EVALUATION COMPLEXITY: Low   GOALS: Goals reviewed with patient? Yes  SHORT TERM GOALS: Target date: 03/25/2022  I with initial HEP Baseline: Goal status: met  LONG TERM GOALS: Target date: 05/20/2022   I with final HEP Baseline:  Goal status: ongoing  2.  Patient will achieve I ambulation x at least 800' with LRAD, on level and unlevel surfaces, no pain in R hip. Baseline:  Goal status: ongoing  3.  Complete 5 x STS in < 12 seconds Baseline: TBD Goal status: INITIAL  4.  Complete TUG in < 12 sec Baseline: TBD Goal status: INITIAL  PLAN: PT FREQUENCY: 2x/week  PT DURATION: 12 weeks  PLANNED INTERVENTIONS: Therapeutic exercises, Therapeutic activity, Neuromuscular re-education, Balance training, Gait training, Patient/Family education, Self Care, Joint mobilization, Ionotophoresis 28m/ml Dexamethasone, and Manual  therapy  PLAN FOR NEXT SESSION: progress standing exercise HEP   Angie Payseur PTA 03/11/2022, 11:06 AM [place] [address]Phone: [phone]  Fax:  [phone] Patient Details  Name: VBARNET BENAVIDESMRN: [id number] of Birth: 904-26-1947Referring Provider:  RMerrilee Seashore MD  Encounter Date: 03/11/2022   SMarcelina Morel DPT 03/11/2022, 11:06 AM  [place] [address]Phone: [phone]  Fax:  [phone]

## 2022-03-17 ENCOUNTER — Ambulatory Visit: Payer: Medicare Other | Attending: Orthopedic Surgery | Admitting: Physical Therapy

## 2022-03-17 DIAGNOSIS — R262 Difficulty in walking, not elsewhere classified: Secondary | ICD-10-CM | POA: Insufficient documentation

## 2022-03-17 DIAGNOSIS — R2681 Unsteadiness on feet: Secondary | ICD-10-CM | POA: Insufficient documentation

## 2022-03-17 DIAGNOSIS — R293 Abnormal posture: Secondary | ICD-10-CM | POA: Diagnosis not present

## 2022-03-17 DIAGNOSIS — M25551 Pain in right hip: Secondary | ICD-10-CM | POA: Insufficient documentation

## 2022-03-17 DIAGNOSIS — R600 Localized edema: Secondary | ICD-10-CM | POA: Diagnosis not present

## 2022-03-17 DIAGNOSIS — R278 Other lack of coordination: Secondary | ICD-10-CM | POA: Diagnosis not present

## 2022-03-17 DIAGNOSIS — M6281 Muscle weakness (generalized): Secondary | ICD-10-CM | POA: Insufficient documentation

## 2022-03-17 NOTE — Therapy (Signed)
OUTPATIENT PHYSICAL THERAPY LOWER EXTREMITY   Patient Name: Evan [name] MRN: [id number] DOB:[birth year], 76 y.o., male Today's Date: 03/17/2022   PT End of Session - 03/17/22 1100     Visit Number 6    Date for PT Re-Evaluation 05/20/22    PT Start Time 34    PT Stop Time 7793    PT Time Calculation (min) 45 min              Past Medical History:  Diagnosis Date   Aneurysm artery, renal ([place]) 07/13/2008   Surgery 2010 at [place]    Coronary artery disease 03/13/1996   LAD PCI '97, LAD/CFX DES 7/12   Hyperlipidemia    PCP follows   Hypertension    OSA on CPAP 07/13/2008   Mild OSA doesn't use CPAP   Pre-diabetes    Sleep apnea    Does not have CPAP   Vertigo    Past Surgical History:  Procedure Laterality Date   COLONOSCOPY     [place]    CORONARY STENT PLACEMENT  04/06/96 & 01/29/11   LAD PCI '97, LAD/CFX DES 7/12   DOPPLER ECHOCARDIOGRAPHY  03/09/2008   EF 50-55%   EYE SURGERY Bilateral    Cataract   NM MYOCAR PERF WALL MOTION  03/09/2008   subtle septal ischemia which decide to treat mediclly   NM MYOCAR PERF WALL MOTION  01/16/2011   showed mild to moderate septal ischemia   renal arterial aneursym  05/21/2009   surgerical resectd by Dr Kennith Gain at [place] Right 02/23/2022   Procedure: [place];  Surgeon: Willaim Sheng, MD;  [facility];  Service: Orthopedics;  Laterality: Right;   WISDOM TOOTH EXTRACTION     Patient Active Problem List   Diagnosis Date Noted   Slow heart rate 06/11/2017   CAD - H/O PCI 03/30/2013   PVD  03/30/2013   HTN (hypertension) 03/30/2013   Dyslipidemia 03/30/2013    PCP: Merrilee Seashore  REFERRING PROVIDER: Willaim Sheng, MD   REFERRING DIAG: Diagnosis Description s/p Rt posterior total hip replacement 8/14   THERAPY DIAG:  Difficulty in walking, not elsewhere classified  Muscle weakness (generalized)  Rationale for  Evaluation and Treatment Rehabilitation  ONSET DATE: 12/25/2021   SUBJECTIVE:   SUBJECTIVE STATEMENT: doing well , amb in carrying SPC. States little to no pain most of the time   PERTINENT HISTORY: Feeling great, "Doctor told me to slow down" PAIN:  Are you having pain? Yes: NPRS scale: 1/10 Pain location: R hip Pain description: grabbing Aggravating factors: walking Relieving factors: rest, pain meds, gentle movement  PRECAUTIONS: None-Dr stated no Posterior hip precautions  WEIGHT BEARING RESTRICTIONS No  FALLS:  Has patient fallen in last 6 months? No  LIVING ENVIRONMENT: Lives with: lives with their family and lives with their spouse Lives in: House/apartment Stairs: Yes: External: 3 steps; on left going up Has following equipment at home: Single point cane, Walker - 2 wheeled, Crutches, shower chair, and elevated commode seat  OCCUPATION: reired  PLOF: Independent  PATIENT GOALS Recover from hip surgery   OBJECTIVE:   EDEMA:  Mild RLE edema  POSTURE: rounded shoulders and flexed trunk   PALPATION: Edematous and mildly TTP around knee on R.  LOWER EXTREMITY ROM:  Active ROM Right eval Left eval  Hip flexion WFL   Hip extension Neutral   Hip abduction WFL   Hip adduction  Hip internal rotation    Hip external rotation    Knee flexion WFL   Knee extension 10   Ankle dorsiflexion WFL   Ankle plantarflexion    Ankle inversion    Ankle eversion     (Blank rows = not tested)  LOWER EXTREMITY MMT:  MMT Right eval Left eval  Hip flexion 2 5  Hip extension 2+ 5  Hip abduction 3- 5  Hip adduction    Hip internal rotation    Hip external rotation    Knee flexion 4- 5  Knee extension 4- 5  Ankle dorsiflexion 4 5  Ankle plantarflexion    Ankle inversion    Ankle eversion     (Blank rows = not tested)  FUNCTIONAL TESTS:  5 times sit to stand: TBD Timed up and go (TUG): TBD  GAIT: Distance walked: 71' Assistive device utilized:  Single point cane Level of assistance: Modified independence Comments: Patient     TODAY'S TREATMENT:  03/17/22  Amb outside without AD 500 navigating uneven terrain and curbs without any LOB or issues TM SW .7 mph 1 min each way 6 inch step up fwd with opp leg ext then laterally with opp leg abd 10 x each 1 UE suuport Nustep L 6 5 min LE only Knee ext 10# 2 sets 15 HS curl 25# 2 sets 15 Side stepping on foam mat in // bars 5x Green tband hip 3 way 15 x -issued as HEP   03/11/22 NuStep L5 x5 min LE only Recumbent bike L 4 x4 min Resisted side steps 30lb 2x10 Hamstring curls 25lb 2x15 Leg Ext 10lb 2x15 Gait with SPC 186f 6in lateral step up x10 each 6in step ups x10 each S2S 2x15 Leg press RLE 20lb x10, Bilateral 40lb x10, x15    03/09/22 Bike L4 x 6 minutes with 2 x 20 sec fast pedal. Sidelying hip abd with forward and back swing x 5, good control. Standing side to side steps for sped of movement, then forward back, 30 sec each. Step ups o8n step, forward, side step, crossover step-patient instructed to avoid any straining with crossover steps. 10 reps each with each leg SLS at counter, max 5 sec on R, 10 on L Step ups onto Airex pad in runners stride for balance, 10 reps each leg.   03/06/22 Nustep L 5 5 min LE only Resisted gait 5 x fwd, back and laterally 30# 5 x STS  9.71 sec TUG 9.46 sec 6 inch step up with opp leg ext 1 Hand Hold 10 x BIL 6 inch step up with opp leg abd 1 HH 10 x BIL  Knee ext 10# 2 ses 10 HS curl 25# 2 sets 10 Leg press 30# 2 sets 10 Black bar DF and PF 15 x each Standing red tband hip 3 way BIL 15 x with UE support Add ball squeeze 15 x 3 sec     03/04/22 NuStep L5 x6 min no UE  S2S 2x10  Resisted gait 30lb 4 way x 3 each Forward step up 6in x 10 each Lateral Step ups 6in x 10 each Hamstring curls 25lb 2x10  Leg Extensions 10lb 2x10 Leg press 20lb 2x10    Education, HEP including AP, HS, QS, GS, ABD, SAQ, Seated long  kicks   PATIENT EDUCATION:  Education details: hip 3 way green tband Person educated: pt Education method: Explanation, Demonstration, and Handouts Education comprehension: verbalized understanding and returned demonstration   HOME EXERCISE PROGRAM: [id number]  ASSESSMENT:  CLINICAL IMPRESSION  Patient reports no issues. Amb in with [facility] but mostly carrying. Amb outside all terrain without any issues or LOB. Progress strengthening with cuing for speed and control. STG met. LTG for TUG and STS met. Issued standing hip ext for HEP OBJECTIVE IMPAIRMENTS Abnormal gait, decreased activity tolerance, decreased balance, decreased coordination, decreased endurance, decreased mobility, difficulty walking, decreased ROM, decreased strength, increased muscle spasms, impaired flexibility, postural dysfunction, and pain.   ACTIVITY LIMITATIONS lifting, bending, sitting, standing, squatting, sleeping, stairs, transfers, bed mobility, and locomotion level  PARTICIPATION LIMITATIONS: meal prep, cleaning, laundry, driving, shopping, community activity, and yard work  PERSONAL FACTORS Age are also affecting patient's functional outcome.   REHAB POTENTIAL: Good  CLINICAL DECISION MAKING: Stable/uncomplicated  EVALUATION COMPLEXITY: Low   GOALS: Goals reviewed with patient? Yes  SHORT TERM GOALS: Target date: 03/25/2022  I with initial HEP Baseline: Goal status: met  LONG TERM GOALS: Target date: 05/20/2022   I with final HEP Baseline:  Goal status: ongoing  2.  Patient will achieve I ambulation x at least 800' with LRAD, on level and unlevel surfaces, no pain in R hip. Baseline:  Goal status: partially met 03/17/22  3.  Complete 5 x STS in < 12 seconds Baseline: 10.6 sec Goal status: met 03/17/22  4.  Complete TUG in < 12 sec Baseline:8.21 sec Goal status: 03/17/22 met  PLAN: PT FREQUENCY: 2x/week  PT DURATION: 12 weeks  PLANNED INTERVENTIONS: Therapeutic exercises, Therapeutic  activity, Neuromuscular re-education, Balance training, Gait training, Patient/Family education, Self Care, Joint mobilization, Ionotophoresis 60m/ml Dexamethasone, and Manual therapy  PLAN FOR NEXT SESSION: progress func and gait   Angie Ailynn Gow PTA 03/17/2022, 11:01 AM [place] [address]Phone: [phone]  Fax:  [phone] Patient Details  Name: Evan KAUFHOLDMRN: [id number] of Birth: 903/01/1947Referring Provider:  RMerrilee Seashore MD  Encounter Date: 03/17/2022   SMarcelina Morel DPT 03/17/2022, 11:01 AM  [place] [address]Phone: [phone]  Fax:  [place] [address]Phone: [phone]  Fax:  [phone] Patient Details  Name: Evan JEZIORSKIMRN: [id number] of Birth: 911-Feb-1947Referring Provider:  RMerrilee Seashore MD  Encounter Date: 03/17/2022   PLaqueta Carina PTA 03/17/2022, 11:00 AM  [place] [address]Phone: [phone]  Fax:  [phone]

## 2022-03-19 ENCOUNTER — Ambulatory Visit: Payer: Medicare Other | Admitting: Physical Therapy

## 2022-03-19 ENCOUNTER — Encounter: Payer: Self-pay | Admitting: Physical Therapy

## 2022-03-19 DIAGNOSIS — R278 Other lack of coordination: Secondary | ICD-10-CM

## 2022-03-19 DIAGNOSIS — R2681 Unsteadiness on feet: Secondary | ICD-10-CM | POA: Diagnosis not present

## 2022-03-19 DIAGNOSIS — R293 Abnormal posture: Secondary | ICD-10-CM

## 2022-03-19 DIAGNOSIS — R600 Localized edema: Secondary | ICD-10-CM

## 2022-03-19 DIAGNOSIS — M25551 Pain in right hip: Secondary | ICD-10-CM

## 2022-03-19 DIAGNOSIS — M6281 Muscle weakness (generalized): Secondary | ICD-10-CM | POA: Diagnosis not present

## 2022-03-19 DIAGNOSIS — R262 Difficulty in walking, not elsewhere classified: Secondary | ICD-10-CM | POA: Diagnosis not present

## 2022-03-19 NOTE — Therapy (Signed)
OUTPATIENT PHYSICAL THERAPY LOWER EXTREMITY   Patient Name: Evan [name] MRN: [id number] DOB:April 10, 1946, 76 y.o., male Today's Date: 03/19/2022   PT End of Session - 03/19/22 1108     Visit Number 7    Date for PT Re-Evaluation 05/20/22    PT Start Time 1102    PT Stop Time 1141    PT Time Calculation (min) 39 min    Activity Tolerance Patient tolerated treatment well    Behavior During Therapy [facility] for tasks assessed/performed               Past Medical History:  Diagnosis Date   Aneurysm artery, renal ([place]) 07/13/2008   Surgery 2010 at [place]    Coronary artery disease 03/13/1996   LAD PCI '97, LAD/CFX DES 7/12   Hyperlipidemia    PCP follows   Hypertension    OSA on CPAP 07/13/2008   Mild OSA doesn't use CPAP   Pre-diabetes    Sleep apnea    Does not have CPAP   Vertigo    Past Surgical History:  Procedure Laterality Date   COLONOSCOPY     [place]    CORONARY STENT PLACEMENT  04/06/96 & 01/29/11   LAD PCI '97, LAD/CFX DES 7/12   DOPPLER ECHOCARDIOGRAPHY  03/09/2008   EF 50-55%   EYE SURGERY Bilateral    Cataract   NM MYOCAR PERF WALL MOTION  03/09/2008   subtle septal ischemia which decide to treat mediclly   NM MYOCAR PERF WALL MOTION  01/16/2011   showed mild to moderate septal ischemia   renal arterial aneursym  05/21/2009   surgerical resectd by Dr Kennith Gain at [place] Right 02/23/2022   Procedure: [place];  Surgeon: Willaim Sheng, MD;  Location: [facility];  Service: Orthopedics;  Laterality: Right;   WISDOM TOOTH EXTRACTION     Patient Active Problem List   Diagnosis Date Noted   Slow heart rate 06/11/2017   CAD - H/O PCI 03/30/2013   PVD  03/30/2013   HTN (hypertension) 03/30/2013   Dyslipidemia 03/30/2013    PCP: Merrilee Seashore  REFERRING PROVIDER: Willaim Sheng, MD   REFERRING DIAG: Diagnosis Description s/p Rt posterior total hip replacement 8/14    THERAPY DIAG:  Difficulty in walking, not elsewhere classified  Muscle weakness (generalized)  Pain in right hip  Abnormal posture  Unsteadiness on feet  Other lack of coordination  Localized edema  Rationale for Evaluation and Treatment Rehabilitation  ONSET DATE: 12/25/2021   SUBJECTIVE:   SUBJECTIVE STATEMENT:  Patient reports some aching in his hip, but it does not interfere in his activities.   PERTINENT HISTORY: Feeling great, "Doctor told me to slow down" PAIN:  Are you having pain? Yes: NPRS scale: 1/10 Pain location: R hip Pain description: grabbing Aggravating factors: walking Relieving factors: rest, pain meds, gentle movement  PRECAUTIONS: None-Dr stated no Posterior hip precautions  WEIGHT BEARING RESTRICTIONS No  FALLS:  Has patient fallen in last 6 months? No  LIVING ENVIRONMENT: Lives with: lives with their family and lives with their spouse Lives in: House/apartment Stairs: Yes: External: 3 steps; on left going up Has following equipment at home: Single point cane, Walker - 2 wheeled, Crutches, shower chair, and elevated commode seat  OCCUPATION: reired  PLOF: Independent  PATIENT GOALS Recover from hip surgery   OBJECTIVE:   EDEMA:  Mild RLE edema  POSTURE: rounded shoulders and flexed trunk  PALPATION: Edematous and mildly TTP around knee on R.  LOWER EXTREMITY ROM:  Active ROM Right eval Left eval  Hip flexion WFL   Hip extension Neutral   Hip abduction [facility]   Hip adduction    Hip internal rotation    Hip external rotation    Knee flexion WFL   Knee extension 10   Ankle dorsiflexion WFL   Ankle plantarflexion    Ankle inversion    Ankle eversion     (Blank rows = not tested)  LOWER EXTREMITY MMT:  MMT Right eval Left eval  Hip flexion 2 5  Hip extension 2+ 5  Hip abduction 3- 5  Hip adduction    Hip internal rotation    Hip external rotation    Knee flexion 4- 5  Knee extension 4- 5  Ankle  dorsiflexion 4 5  Ankle plantarflexion    Ankle inversion    Ankle eversion     (Blank rows = not tested)  FUNCTIONAL TESTS:  5 times sit to stand: TBD Timed up and go (TUG): TBD  GAIT: Distance walked: 70' Assistive device utilized: Single point cane Level of assistance: Modified independence Comments: Patient     TODAY'S TREATMENT: 03/19/22 Recumbent bike L3.5 x 6 minutes. 3 way kicks with Green Tband resistance-Mod VC and TC for correct form and technique. SLS at step, alternating taps while standing on Airex pad.  SLS alternating taps with rotation, reaching side to side on the step. Box stepping in each direction for speed F/B changing directions at the command of therapist. Heel raises 2 x 10 on step   03/17/22 Amb outside without AD 500 navigating uneven terrain and curbs without any LOB or issues TM SW .7 mph 1 min each way 6 inch step up fwd with opp leg ext then laterally with opp leg abd 10 x each 1 UE suuport Nustep L 6 5 min LE only Knee ext 10# 2 sets 15 HS curl 25# 2 sets 15 Side stepping on foam mat in // bars 5x Green tband hip 3 way 15 x -issued as HEP   03/11/22 NuStep L5 x5 min LE only Recumbent bike L 4 x4 min Resisted side steps 30lb 2x10 Hamstring curls 25lb 2x15 Leg Ext 10lb 2x15 Gait with SPC 156f 6in lateral step up x10 each 6in step ups x10 each S2S 2x15 Leg press RLE 20lb x10, Bilateral 40lb x10, x15    03/09/22 Bike L4 x 6 minutes with 2 x 20 sec fast pedal. Sidelying hip abd with forward and back swing x 5, good control. Standing side to side steps for sped of movement, then forward back, 30 sec each. Step ups o8n step, forward, side step, crossover step-patient instructed to avoid any straining with crossover steps. 10 reps each with each leg SLS at counter, max 5 sec on R, 10 on L Step ups onto Airex pad in runners stride for balance, 10 reps each leg.   03/06/22 Nustep L 5 5 min LE only Resisted gait 5 x fwd, back and laterally  30# 5 x STS  9.71 sec TUG 9.46 sec 6 inch step up with opp leg ext 1 Hand Hold 10 x BIL 6 inch step up with opp leg abd 1 HH 10 x BIL  Knee ext 10# 2 ses 10 HS curl 25# 2 sets 10 Leg press 30# 2 sets 10 Black bar DF and PF 15 x each Standing red tband hip 3 way BIL 15 x with UE  support Add ball squeeze 15 x 3 sec     03/04/22 NuStep L5 x6 min no UE  S2S 2x10  Resisted gait 30lb 4 way x 3 each Forward step up 6in x 10 each Lateral Step ups 6in x 10 each Hamstring curls 25lb 2x10  Leg Extensions 10lb 2x10 Leg press 20lb 2x10    Education, HEP including AP, HS, QS, GS, ABD, SAQ, Seated long kicks   PATIENT EDUCATION:  Education details: hip 3 way green tband Person educated: pt Education method: Explanation, Demonstration, and Handouts Education comprehension: verbalized understanding and returned demonstration   HOME EXERCISE PROGRAM: [id number]  ASSESSMENT:  CLINICAL IMPRESSION  Patient reports no issues. No cane today. Treatment emphasized quality of movement and strengthening for  hip stability, as well as speed of movement.  OBJECTIVE IMPAIRMENTS Abnormal gait, decreased activity tolerance, decreased balance, decreased coordination, decreased endurance, decreased mobility, difficulty walking, decreased ROM, decreased strength, increased muscle spasms, impaired flexibility, postural dysfunction, and pain.   ACTIVITY LIMITATIONS lifting, bending, sitting, standing, squatting, sleeping, stairs, transfers, bed mobility, and locomotion level  PARTICIPATION LIMITATIONS: meal prep, cleaning, laundry, driving, shopping, community activity, and yard work  PERSONAL FACTORS Age are also affecting patient's functional outcome.   REHAB POTENTIAL: Good  CLINICAL DECISION MAKING: Stable/uncomplicated  EVALUATION COMPLEXITY: Low   GOALS: Goals reviewed with patient? Yes  SHORT TERM GOALS: Target date: 03/25/2022  I with initial HEP Baseline: Goal status: met  LONG TERM  GOALS: Target date: 05/20/2022   I with final HEP Baseline:  Goal status: ongoing  2.  Patient will achieve I ambulation x at least 800' with LRAD, on level and unlevel surfaces, no pain in R hip. Baseline:  Goal status: partially met 03/17/22  3.  Complete 5 x STS in < 12 seconds Baseline: 10.6 sec Goal status: met 03/17/22  4.  Complete TUG in < 12 sec Baseline:8.21 sec Goal status: 03/17/22 met  PLAN: PT FREQUENCY: 2x/week  PT DURATION: 12 weeks  PLANNED INTERVENTIONS: Therapeutic exercises, Therapeutic activity, Neuromuscular re-education, Balance training, Gait training, Patient/Family education, Self Care, Joint mobilization, Ionotophoresis 75m/ml Dexamethasone, and Manual therapy  PLAN FOR NEXT SESSION: progress func and gait   Angie Payseur PTA 03/19/2022, 11:42 AM [place] [address]Phone: [phone]  Fax:  [phone] Patient Details  Name: VRASAAN BROTHERTONMRN: [id number] of Birth: [birth year]Referring Provider:  RMerrilee Seashore MD  Encounter Date: 03/19/2022   SMarcelina Morel DPT 03/19/2022, 11:42 AM  [place] [address]Phone: [phone]  Fax:  [address] [address]Phone: [phone]  Fax:  [phone] Patient Details  Name: VHUSAYN REIMMRN: [id number] of Birth: 9Sep 17, 1947Referring Provider:  RMerrilee Seashore MD  Encounter Date: 03/19/2022   SMarcelina Morel DPT 03/19/2022, 11:42 AM  [place] [address]Phone: [phone]  Fax:  [phone]

## 2022-03-23 ENCOUNTER — Encounter: Payer: Self-pay | Admitting: Physical Therapy

## 2022-03-23 ENCOUNTER — Ambulatory Visit: Payer: Medicare Other | Admitting: Physical Therapy

## 2022-03-23 DIAGNOSIS — M25551 Pain in right hip: Secondary | ICD-10-CM | POA: Diagnosis not present

## 2022-03-23 DIAGNOSIS — R278 Other lack of coordination: Secondary | ICD-10-CM | POA: Diagnosis not present

## 2022-03-23 DIAGNOSIS — R2681 Unsteadiness on feet: Secondary | ICD-10-CM

## 2022-03-23 DIAGNOSIS — R262 Difficulty in walking, not elsewhere classified: Secondary | ICD-10-CM | POA: Diagnosis not present

## 2022-03-23 DIAGNOSIS — R600 Localized edema: Secondary | ICD-10-CM

## 2022-03-23 DIAGNOSIS — M6281 Muscle weakness (generalized): Secondary | ICD-10-CM

## 2022-03-23 DIAGNOSIS — R293 Abnormal posture: Secondary | ICD-10-CM

## 2022-03-23 NOTE — Therapy (Signed)
OUTPATIENT PHYSICAL THERAPY LOWER EXTREMITY   Patient Name: Evan [name] MRN: [id number] DOB:04/13/1946, 76 y.o., male Today's Date: 03/23/2022   PT End of Session - 03/23/22 1106     Visit Number 8    Date for PT Re-Evaluation 05/20/22    PT Start Time 1101    PT Stop Time 1140    PT Time Calculation (min) 39 min    Activity Tolerance Patient tolerated treatment well    Behavior During Therapy Evan [name] for tasks assessed/performed                Past Medical History:  Diagnosis Date   Aneurysm artery, renal ([place]) 07/13/2008   Surgery 2010 at [place]    Coronary artery disease 03/13/1996   LAD PCI '97, LAD/CFX DES 7/12   Hyperlipidemia    PCP follows   Hypertension    OSA on CPAP 07/13/2008   Mild OSA doesn't use CPAP   Pre-diabetes    Sleep apnea    Does not have CPAP   Vertigo    Past Surgical History:  Procedure Laterality Date   COLONOSCOPY     [place]    CORONARY STENT PLACEMENT  04/06/96 & 01/29/11   LAD PCI '97, LAD/CFX DES 7/12   DOPPLER ECHOCARDIOGRAPHY  03/09/2008   EF 50-55%   EYE SURGERY Bilateral    Cataract   NM MYOCAR PERF WALL MOTION  03/09/2008   subtle septal ischemia which decide to treat mediclly   NM MYOCAR PERF WALL MOTION  01/16/2011   showed mild to moderate septal ischemia   renal arterial aneursym  05/21/2009   surgerical resectd by Evan Evan [name] at [place] Right 02/23/2022   Procedure: [name];  Surgeon: Evan Sheng, MD;  Location[facility];  Service: Orthopedics;  Laterality: Right;   WISDOM TOOTH EXTRACTION     Patient Active Problem List   Diagnosis Date Noted   Slow heart rate 06/11/2017   CAD - H/O PCI 03/30/2013   PVD  03/30/2013   HTN (hypertension) 03/30/2013   Dyslipidemia 03/30/2013    PCP: Evan [name]  REFERRING PROVIDER: Willaim Sheng, MD   REFERRING DIAG: Diagnosis Description s/p Rt posterior total hip replacement 8/14    THERAPY DIAG:  Difficulty in walking, not elsewhere classified  Muscle weakness (generalized)  Pain in right hip  Abnormal posture  Unsteadiness on feet  Other lack of coordination  Localized edema  Rationale for Evaluation and Treatment Rehabilitation  ONSET DATE: 12/25/2021   SUBJECTIVE:   SUBJECTIVE STATEMENT:  Patient reports continuing pain in his hip which fluctuates, but does occasionally get sharp.   PERTINENT HISTORY: Feeling great, "Doctor told me to slow down" PAIN:  Are you having pain? Yes: NPRS scale: 1/10 Pain location: R hip Pain description: grabbing Aggravating factors: walking Relieving factors: rest, pain meds, gentle movement  [name]: None-Evan [name]  WEIGHT BEARING RESTRICTIONS No  FALLS:  Has patient fallen in last 6 months? No  LIVING ENVIRONMENT: Lives with: lives with their family and lives with their spouse Lives in: House/apartment Stairs: Yes: External: 3 steps; on left going up Has following equipment at home: Single point cane, Walker - 2 wheeled, Crutches, shower chair, and elevated commode seat  OCCUPATION: reired  PLOF: Independent  PATIENT GOALS Recover from hip surgery   OBJECTIVE:   EDEMA:  Mild RLE edema  POSTURE: rounded shoulders and flexed trunk  PALPATION: Edematous and mildly TTP around knee on R.  LOWER EXTREMITY ROM:  Active ROM Right eval Left eval  Hip flexion WFL   Hip extension Neutral   Hip abduction [facility]   Hip adduction    Hip internal rotation    Hip external rotation    Knee flexion WFL   Knee extension 10   Ankle dorsiflexion WFL   Ankle plantarflexion    Ankle inversion    Ankle eversion     (Blank rows = not tested)  LOWER EXTREMITY MMT:  MMT Right eval Left eval  Hip flexion 2 5  Hip extension 2+ 5  Hip abduction 3- 5  Hip adduction    Hip internal rotation    Hip external rotation    Knee flexion 4- 5  Knee extension 4- 5  Ankle  dorsiflexion 4 5  Ankle plantarflexion    Ankle inversion    Ankle eversion     (Blank rows = not tested)  FUNCTIONAL TESTS:  5 times sit to stand: TBD Timed up and go (TUG): TBD  GAIT: Distance walked: 55' Assistive device utilized: Single point cane Level of assistance: Modified independence Comments: Patient     TODAY'S TREATMENT: 03/23/22 NuStep L5 x 5 minutes then 3 x 30 seconds @ L7 Hip abductor strengthening in parallel bars-lateral tilt to R and back. Patient had difficulty stabilizing and also balancing. Stanidng march while holding 5# weight in Bue to encourage upright posture, 2 x 10 each leg Leg press- 20#, 10 with BLE, 10 reps with each single LE. Standing hip abd, 2 x 10 each leg, Green T band at ankles Resisted gait, 30#- forward step ups onto 4" step, 10 reps each, then 5 reps backwards, and to each side.  03/19/22 Recumbent bike L3.5 x 6 minutes. 3 way kicks with Green Tband resistance-Mod VC and TC for correct form and technique. SLS at step, alternating taps while standing on Airex pad.  SLS alternating taps with rotation, reaching side to side on the step. Box stepping in each direction for speed F/B changing directions at the command of therapist. Heel raises 2 x 10 on step   03/17/22 Amb outside without AD 500 navigating uneven terrain and curbs without any LOB or issues TM SW .7 mph 1 min each way 6 inch step up fwd with opp leg ext then laterally with opp leg abd 10 x each 1 UE suuport Nustep L 6 5 min LE only Knee ext 10# 2 sets 15 HS curl 25# 2 sets 15 Side stepping on foam mat in // bars 5x Green tband hip 3 way 15 x -issued as HEP   03/11/22 NuStep L5 x5 min LE only Recumbent bike L 4 x4 min Resisted side steps 30lb 2x10 Hamstring curls 25lb 2x15 Leg Ext 10lb 2x15 Gait with SPC 172f 6in lateral step up x10 each 6in step ups x10 each S2S 2x15 Leg press RLE 20lb x10, Bilateral 40lb x10, x15    03/09/22 Bike L4 x 6 minutes with 2 x 20  sec fast pedal. Sidelying hip abd with forward and back swing x 5, good control. Standing side to side steps for sped of movement, then forward back, 30 sec each. Step ups o8n step, forward, side step, crossover step-patient instructed to avoid any straining with crossover steps. 10 reps each with each leg SLS at counter, max 5 sec on R, 10 on L Step ups onto Airex pad in runners stride for balance, 10 reps each  leg.   03/06/22 Nustep L 5 5 min LE only Resisted gait 5 x fwd, back and laterally 30# 5 x STS  9.71 sec TUG 9.46 sec 6 inch step up with opp leg ext 1 Hand Hold 10 x BIL 6 inch step up with opp leg abd 1 HH 10 x BIL  Knee ext 10# 2 ses 10 HS curl 25# 2 sets 10 Leg press 30# 2 sets 10 Black bar DF and PF 15 x each Standing red tband hip 3 way BIL 15 x with UE support Add ball squeeze 15 x 3 sec     03/04/22 NuStep L5 x6 min no UE  S2S 2x10  Resisted gait 30lb 4 way x 3 each Forward step up 6in x 10 each Lateral Step ups 6in x 10 each Hamstring curls 25lb 2x10  Leg Extensions 10lb 2x10 Leg press 20lb 2x10    Education, HEP including AP, HS, QS, GS, ABD, SAQ, Seated long kicks   PATIENT EDUCATION:  Education details: hip 3 way green tband Person educated: pt Education method: Explanation, Demonstration, and Handouts Education comprehension: verbalized understanding and returned demonstration   HOME EXERCISE PROGRAM: [id number]  ASSESSMENT:  CLINICAL IMPRESSION  Patient reports no issues. Therapist progressed strength and balance training.  OBJECTIVE IMPAIRMENTS Abnormal gait, decreased activity tolerance, decreased balance, decreased coordination, decreased endurance, decreased mobility, difficulty walking, decreased ROM, decreased strength, increased muscle spasms, impaired flexibility, postural dysfunction, and pain.   ACTIVITY LIMITATIONS lifting, bending, sitting, standing, squatting, sleeping, stairs, transfers, bed mobility, and locomotion  level  PARTICIPATION LIMITATIONS: meal prep, cleaning, laundry, driving, shopping, community activity, and yard work  PERSONAL FACTORS Age are also affecting patient's functional outcome.   REHAB POTENTIAL: Good  CLINICAL DECISION MAKING: Stable/uncomplicated  EVALUATION COMPLEXITY: Low   GOALS: Goals reviewed with patient? Yes  SHORT TERM GOALS: Target date: 03/25/2022  I with initial HEP Baseline: Goal status: met  LONG TERM GOALS: Target date: 05/20/2022   I with final HEP Baseline:  Goal status: ongoing  2.  Patient will achieve I ambulation x at least 800' with LRAD, on level and unlevel surfaces, no pain in R hip. Baseline:  Goal status: partially met 03/17/22  3.  Complete 5 x STS in < 12 seconds Baseline: 10.6 sec Goal status: met 03/17/22  4.  Complete TUG in < 12 sec Baseline:8.21 sec Goal status: 03/17/22 met  PLAN: PT FREQUENCY: 2x/week  PT DURATION: 12 weeks  PLANNED INTERVENTIONS: Therapeutic exercises, Therapeutic activity, Neuromuscular re-education, Balance training, Gait training, Patient/Family education, Self Care, Joint mobilization, Ionotophoresis 66m/ml Dexamethasone, and Manual therapy  PLAN FOR NEXT SESSION: progress func and gait  Patient Details  Name: Evan LITZINGERMRN: [id number] of Birth: 9Feb 14, 1947Referring Provider:  RMerrilee Seashore MD  Encounter Date: 03/23/2022   SMarcelina Morel DPT 03/23/2022, 11:48 AM  [place] [address]Phone: [phone]  Fax:  3[place] [address]Phone: [phone]  Fax:  [phone] Patient Details  Name: Evan WASCOMRN: [id number] of Birth: 908-02-1947Referring Provider:  RMerrilee Seashore MD  Encounter Date: 03/23/2022   SMarcelina Morel DPT 03/23/2022, 11:48 AM  [place] [address]Phone: [phone]  Fax:  [phone]

## 2022-03-25 ENCOUNTER — Encounter: Payer: Self-pay | Admitting: Physical Therapy

## 2022-03-25 ENCOUNTER — Ambulatory Visit: Payer: Medicare Other | Admitting: Physical Therapy

## 2022-03-25 DIAGNOSIS — R278 Other lack of coordination: Secondary | ICD-10-CM | POA: Diagnosis not present

## 2022-03-25 DIAGNOSIS — R262 Difficulty in walking, not elsewhere classified: Secondary | ICD-10-CM | POA: Diagnosis not present

## 2022-03-25 DIAGNOSIS — R293 Abnormal posture: Secondary | ICD-10-CM | POA: Diagnosis not present

## 2022-03-25 DIAGNOSIS — M25551 Pain in right hip: Secondary | ICD-10-CM

## 2022-03-25 DIAGNOSIS — M6281 Muscle weakness (generalized): Secondary | ICD-10-CM | POA: Diagnosis not present

## 2022-03-25 DIAGNOSIS — R2681 Unsteadiness on feet: Secondary | ICD-10-CM | POA: Diagnosis not present

## 2022-03-25 NOTE — Therapy (Signed)
OUTPATIENT PHYSICAL THERAPY LOWER EXTREMITY   Patient Name: Evan [name] MRN: [id number] DOB:February 15, 1946, 76 y.o., male Today's Date: 03/25/2022   PT End of Session - 03/25/22 1100     Visit Number 9    Date for PT Re-Evaluation 05/20/22    PT Start Time 63    PT Stop Time 1145    PT Time Calculation (min) 45 min    Activity Tolerance Patient tolerated treatment well    Behavior During Therapy [facility] for tasks assessed/performed                Past Medical History:  Diagnosis Date   Aneurysm artery, renal ([place]) 07/13/2008   Surgery 2010 at [facility]    Coronary artery disease 03/13/1996   LAD PCI '97, LAD/CFX DES 7/12   Hyperlipidemia    PCP follows   Hypertension    OSA on CPAP 07/13/2008   Mild OSA doesn't use CPAP   Pre-diabetes    Sleep apnea    Does not have CPAP   Vertigo    Past Surgical History:  Procedure Laterality Date   COLONOSCOPY     [place]    CORONARY STENT PLACEMENT  04/06/96 & 01/29/11   LAD PCI '97, LAD/CFX DES 7/12   DOPPLER ECHOCARDIOGRAPHY  03/09/2008   EF 50-55%   EYE SURGERY Bilateral    Cataract   NM MYOCAR PERF WALL MOTION  03/09/2008   subtle septal ischemia which decide to treat mediclly   NM MYOCAR PERF WALL MOTION  01/16/2011   showed mild to moderate septal ischemia   renal arterial aneursym  05/21/2009   surgerical resectd by Dr Kennith Gain at [facility] Right 02/23/2022   Procedure: [place];  Surgeon: Willaim Sheng, MD;  Location: [facility];  Service: Orthopedics;  Laterality: Right;   WISDOM TOOTH EXTRACTION     Patient Active Problem List   Diagnosis Date Noted   Slow heart rate 06/11/2017   CAD - H/O PCI 03/30/2013   PVD  03/30/2013   HTN (hypertension) 03/30/2013   Dyslipidemia 03/30/2013    PCP: Merrilee Seashore  REFERRING PROVIDER: Willaim Sheng, MD   REFERRING DIAG: Diagnosis Description s/p Rt posterior total hip replacement 8/14    THERAPY DIAG:  Difficulty in walking, not elsewhere classified  Muscle weakness (generalized)  Pain in right hip  Abnormal posture  Rationale for Evaluation and Treatment Rehabilitation  ONSET DATE: 12/25/2021   SUBJECTIVE:   SUBJECTIVE STATEMENT:   "Good, great, Tired" "Didn't sleep last night"   PERTINENT HISTORY: Feeling great, "Doctor told me to slow down" PAIN:  Are you having pain? Yes: NPRS scale: 0/10 Pain location: R hip Pain description: grabbing Aggravating factors: walking Relieving factors: rest, pain meds, gentle movement  PRECAUTIONS: None-Dr stated no Posterior hip precautions  WEIGHT BEARING RESTRICTIONS No  FALLS:  Has patient fallen in last 6 months? No  LIVING ENVIRONMENT: Lives with: lives with their family and lives with their spouse Lives in: House/apartment Stairs: Yes: External: 3 steps; on left going up Has following equipment at home: Single point cane, Walker - 2 wheeled, Crutches, shower chair, and elevated commode seat  OCCUPATION: reired  PLOF: Independent  PATIENT GOALS Recover from hip surgery   OBJECTIVE:   EDEMA:  Mild RLE edema  POSTURE: rounded shoulders and flexed trunk   PALPATION: Edematous and mildly TTP around knee on R.  LOWER EXTREMITY ROM:  Active ROM Right eval  Left eval  Hip flexion WFL   Hip extension Neutral   Hip abduction [facility]   Hip adduction    Hip internal rotation    Hip external rotation    Knee flexion WFL   Knee extension 10   Ankle dorsiflexion WFL   Ankle plantarflexion    Ankle inversion    Ankle eversion     (Blank rows = not tested)  LOWER EXTREMITY MMT:  MMT Right eval Left eval  Hip flexion 2 5  Hip extension 2+ 5  Hip abduction 3- 5  Hip adduction    Hip internal rotation    Hip external rotation    Knee flexion 4- 5  Knee extension 4- 5  Ankle dorsiflexion 4 5  Ankle plantarflexion    Ankle inversion    Ankle eversion     (Blank rows = not  tested)  FUNCTIONAL TESTS:  5 times sit to stand: TBD Timed up and go (TUG): TBD  GAIT: Distance walked: 56' Assistive device utilized: Single point cane Level of assistance: Modified independence Comments: Patient     TODAY'S TREATMENT: 03/25/22 Recumbent bike L 4.5 x6 min Gait outside to back building down and up two flights of stairs, back down out the door and up hill S2S holding blue ball 2x10 Supine bridges x15 Supine bridge and march 2x10 Supine SL bridge x10 each  Supine bridge LE on pball 2x15 Leg press 40lb 2x15   03/23/22 NuStep L5 x 5 minutes then 3 x 30 seconds @ L7 Hip abductor strengthening in parallel bars-lateral tilt to R and back. Patient had difficulty stabilizing and also balancing. Stanidng march while holding 5# weight in Bue to encourage upright posture, 2 x 10 each leg Leg press- 20#, 10 with BLE, 10 reps with each single LE. Standing hip abd, 2 x 10 each leg, Green T band at ankles Resisted gait, 30#- forward step ups onto 4" step, 10 reps each, then 5 reps backwards, and to each side.  03/19/22 Recumbent bike L3.5 x 6 minutes. 3 way kicks with Green Tband resistance-Mod VC and TC for correct form and technique. SLS at step, alternating taps while standing on Airex pad.  SLS alternating taps with rotation, reaching side to side on the step. Box stepping in each direction for speed F/B changing directions at the command of therapist. Heel raises 2 x 10 on step   03/17/22 Amb outside without AD 500 navigating uneven terrain and curbs without any LOB or issues TM SW .7 mph 1 min each way 6 inch step up fwd with opp leg ext then laterally with opp leg abd 10 x each 1 UE suuport Nustep L 6 5 min LE only Knee ext 10# 2 sets 15 HS curl 25# 2 sets 15 Side stepping on foam mat in // bars 5x Green tband hip 3 way 15 x -issued as HEP   03/11/22 NuStep L5 x5 min LE only Recumbent bike L 4 x4 min Resisted side steps 30lb 2x10 Hamstring curls 25lb  2x15 Leg Ext 10lb 2x15 Gait with SPC 178f 6in lateral step up x10 each 6in step ups x10 each S2S 2x15 Leg press RLE 20lb x10, Bilateral 40lb x10, x15    03/09/22 Bike L4 x 6 minutes with 2 x 20 sec fast pedal. Sidelying hip abd with forward and back swing x 5, good control. Standing side to side steps for sped of movement, then forward back, 30 sec each. Step ups o8n step, forward, side step,  crossover step-patient instructed to avoid any straining with crossover steps. 10 reps each with each leg SLS at counter, max 5 sec on R, 10 on L Step ups onto Airex pad in runners stride for balance, 10 reps each leg.   03/06/22 Nustep L 5 5 min LE only Resisted gait 5 x fwd, back and laterally 30# 5 x STS  9.71 sec TUG 9.46 sec 6 inch step up with opp leg ext 1 Hand Hold 10 x BIL 6 inch step up with opp leg abd 1 HH 10 x BIL  Knee ext 10# 2 ses 10 HS curl 25# 2 sets 10 Leg press 30# 2 sets 10 Black bar DF and PF 15 x each Standing red tband hip 3 way BIL 15 x with UE support Add ball squeeze 15 x 3 sec     03/04/22 NuStep L5 x6 min no UE  S2S 2x10  Resisted gait 30lb 4 way x 3 each Forward step up 6in x 10 each Lateral Step ups 6in x 10 each Hamstring curls 25lb 2x10  Leg Extensions 10lb 2x10 Leg press 20lb 2x10    Education, HEP including AP, HS, QS, GS, ABD, SAQ, Seated long kicks   PATIENT EDUCATION:  Education details: hip 3 way green tband Person educated: pt Education method: Explanation, Demonstration, and Handouts Education comprehension: verbalized understanding and returned demonstration   HOME EXERCISE PROGRAM: [id number]  ASSESSMENT:  CLINICAL IMPRESSION  Pt enters feeling well. Session focuses on functional task as well as glut isolation strengthening. Cues needed to slow down when descending stairs. Some weakness and fatigue present with supine interventions. No reports of increase pain.  OBJECTIVE IMPAIRMENTS Abnormal gait, decreased activity tolerance,  decreased balance, decreased coordination, decreased endurance, decreased mobility, difficulty walking, decreased ROM, decreased strength, increased muscle spasms, impaired flexibility, postural dysfunction, and pain.   ACTIVITY LIMITATIONS lifting, bending, sitting, standing, squatting, sleeping, stairs, transfers, bed mobility, and locomotion level  PARTICIPATION LIMITATIONS: meal prep, cleaning, laundry, driving, shopping, community activity, and yard work  PERSONAL FACTORS Age are also affecting patient's functional outcome.   REHAB POTENTIAL: Good  CLINICAL DECISION MAKING: Stable/uncomplicated  EVALUATION COMPLEXITY: Low   GOALS: Goals reviewed with patient? Yes  SHORT TERM GOALS: Target date: 03/25/2022  I with initial HEP Baseline: Goal status: met  LONG TERM GOALS: Target date: 05/20/2022   I with final HEP Baseline:  Goal status: ongoing  2.  Patient will achieve I ambulation x at least 800' with LRAD, on level and unlevel surfaces, no pain in R hip. Baseline:  Goal status: partially met 03/17/22  3.  Complete 5 x STS in < 12 seconds Baseline: 10.6 sec Goal status: met 03/17/22  4.  Complete TUG in < 12 sec Baseline:8.21 sec Goal status: 03/17/22 met  PLAN: PT FREQUENCY: 2x/week  PT DURATION: 12 weeks  PLANNED INTERVENTIONS: Therapeutic exercises, Therapeutic activity, Neuromuscular re-education, Balance training, Gait training, Patient/Family education, Self Care, Joint mobilization, Ionotophoresis 40m/ml Dexamethasone, and Manual therapy  PLAN FOR NEXT SESSION: progress func and gait

## 2022-04-13 DIAGNOSIS — M1611 Unilateral primary osteoarthritis, right hip: Secondary | ICD-10-CM | POA: Diagnosis not present

## 2022-04-15 DIAGNOSIS — Z23 Encounter for immunization: Secondary | ICD-10-CM | POA: Diagnosis not present

## 2022-04-22 DIAGNOSIS — E785 Hyperlipidemia, unspecified: Secondary | ICD-10-CM | POA: Diagnosis not present

## 2022-04-22 DIAGNOSIS — R7309 Other abnormal glucose: Secondary | ICD-10-CM | POA: Diagnosis not present

## 2022-04-29 DIAGNOSIS — R801 Persistent proteinuria, unspecified: Secondary | ICD-10-CM | POA: Diagnosis not present

## 2022-04-29 DIAGNOSIS — E785 Hyperlipidemia, unspecified: Secondary | ICD-10-CM | POA: Diagnosis not present

## 2022-04-29 DIAGNOSIS — R7309 Other abnormal glucose: Secondary | ICD-10-CM | POA: Diagnosis not present

## 2022-04-29 DIAGNOSIS — I129 Hypertensive chronic kidney disease with stage 1 through stage 4 chronic kidney disease, or unspecified chronic kidney disease: Secondary | ICD-10-CM | POA: Diagnosis not present

## 2022-04-29 DIAGNOSIS — N182 Chronic kidney disease, stage 2 (mild): Secondary | ICD-10-CM | POA: Diagnosis not present

## 2022-04-29 DIAGNOSIS — I2510 Atherosclerotic heart disease of native coronary artery without angina pectoris: Secondary | ICD-10-CM | POA: Diagnosis not present

## 2022-08-25 ENCOUNTER — Encounter: Payer: Self-pay | Admitting: Cardiovascular Disease

## 2022-08-25 ENCOUNTER — Ambulatory Visit: Payer: Medicare Other | Attending: Cardiovascular Disease | Admitting: Cardiovascular Disease

## 2022-08-25 VITALS — BP 148/76 | HR 53 | Ht 71.0 in | Wt 171.0 lb

## 2022-08-25 DIAGNOSIS — E785 Hyperlipidemia, unspecified: Secondary | ICD-10-CM | POA: Diagnosis not present

## 2022-08-25 DIAGNOSIS — I2510 Atherosclerotic heart disease of native coronary artery without angina pectoris: Secondary | ICD-10-CM | POA: Diagnosis not present

## 2022-08-25 DIAGNOSIS — I10 Essential (primary) hypertension: Secondary | ICD-10-CM | POA: Diagnosis not present

## 2022-08-25 NOTE — Patient Instructions (Signed)
Medication Instructions:  Your physician recommends that you continue on your current medications as directed. Please refer to the Current Medication list given to you today.  *If you need a refill on your cardiac medications before your next appointment, please call your pharmacy*   Follow-Up: At [facility], you and your health needs are our priority.  As part of our continuing mission to provide you with exceptional heart care, we have created designated Provider Care Teams.  These Care Teams include your primary Cardiologist (physician) and Advanced Practice Providers (APPs -  Physician Assistants and Nurse Practitioners) who all work together to provide you with the care you need, when you need it.  We recommend signing up for the patient portal called "MyChart".  Sign up information is provided on this After Visit Summary.  MyChart is used to connect with patients for Virtual Visits (Telemedicine).  Patients are able to view lab/test results, encounter notes, upcoming appointments, etc.  Non-urgent messages can be sent to your provider as well.   To learn more about what you can do with MyChart, go to [email or web address].    Your next appointment:   12 month(s)  Provider:   Quay Burow, MD

## 2022-08-25 NOTE — Progress Notes (Signed)
08/25/2022 Evan [name]   1946/01/07  [id number]  Primary Physician Merrilee Seashore, MD Primary Cardiologist: Lorretta Harp MD [name], [place]  HPI:  Evan [name] is a 77 y.o.     thin-appearing, married Asian male, father of 2, grandfather to 1 grandchild who is retired from [employer]. I last saw him 09/24/2021. His wife was a Software engineer at [employer], and recently retired.  He has a history of CAD status post LAD stenting by Dr. Ellouise Newer April 07, 1996, with a J&J PS-1530 stent. 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 Kennith Gain at [facility] in 2010. He did have a follow up evaluation there 2 yrs later and was released from their service.. Because of complaints of an uncomfortable pressure sensation in his chest, a Myoview stress test performed January 16, 2011, showed mild to moderate septal ischemia. He was catheterized January 29, 2011, revealing high-grade proximal LAD as well as AV groove circumflex disease, both of which were stented with Resolute drug-eluting stents. His previously placed LAD stent was widely patent. He has been asymptomatic since. His other problems include hypertension and hyperlipidemia.  He does travel quite a bit to [place] as well as [place] where his son and grandchildren live.    He went to [place] and [place] since I saw him last and injured his right hip walking up a mountain.  He ultimately underwent total hip replacement 02/24/2022 and is since recovered.  He is working out at [employer] daily.  He denies chest pain or shortness of breath.   Current Meds  Medication Sig   amLODipine (NORVASC) 5 MG tablet Take 2.5 mg by mouth in the morning and at bedtime.   atorvastatin (LIPITOR) 20 MG tablet Take 20 mg by mouth every evening.   Calcium Carb-Cholecalciferol (CALCIUM 500/VITAMIN D PO) Take 1 tablet by mouth in the morning and at bedtime.   Coenzyme Q10 (COQ10) 100 MG  CAPS Take 100 mg by mouth every evening.   Melatonin 10 MG TABS Take 10 mg by mouth at bedtime as needed (sleep).   metoprolol tartrate (LOPRESSOR) 25 MG tablet Take 0.5 tablets (12.5 mg total) by mouth 2 (two) times daily. (Patient taking differently: Take 25 mg by mouth 2 (two) times daily.)   Multiple Vitamin (MULTIVITAMIN) tablet Take 1 tablet by mouth daily.   olmesartan (BENICAR) 40 MG tablet Take 20 mg by mouth in the morning and at bedtime.   Omega-3 Fatty Acids (FISH OIL) 1200 MG CAPS Take 1,200 mg by mouth daily.     Allergies  Allergen Reactions   Plavix [Clopidogrel Bisulfate]     Possible Rash     Social History   Socioeconomic History   Marital status: Married    Spouse name: Not on file   Number of children: Not on file   Years of education: Not on file   Highest education level: Not on file  Occupational History   Not on file  Tobacco Use   Smoking status: Never   Smokeless tobacco: Never  Vaping Use   Vaping Use: Never used  Substance and Sexual Activity   Alcohol use: Yes    Comment: rarely   Drug use: No   Sexual activity: Not Currently  Other Topics Concern   Not on file  Social History Narrative   Not on file   Social Determinants of Health   Financial Resource  Strain: Not on file  Food Insecurity: Not on file  Transportation Needs: Not on file  Physical Activity: Not on file  Stress: Not on file  Social Connections: Not on file  Intimate Partner Violence: Not on file     Review of Systems: General: negative for chills, fever, night sweats or weight changes.  Cardiovascular: negative for chest pain, dyspnea on exertion, edema, orthopnea, palpitations, paroxysmal nocturnal dyspnea or shortness of breath Dermatological: negative for rash Respiratory: negative for cough or wheezing Urologic: negative for hematuria Abdominal: negative for nausea, vomiting, diarrhea, bright red blood per rectum, melena, or hematemesis Neurologic: negative for  visual changes, syncope, or dizziness All other systems reviewed and are otherwise negative except as noted above.    Blood pressure (!) 148/76, pulse (!) 53, height 5' 11"$  (1.803 m), weight 171 lb (77.6 kg).  General appearance: alert and no distress Neck: no adenopathy, no carotid bruit, no JVD, supple, symmetrical, trachea midline, and thyroid not enlarged, symmetric, no tenderness/mass/nodules Lungs: clear to auscultation bilaterally Heart: regular rate and rhythm, S1, S2 normal, no murmur, click, rub or gallop Extremities: extremities normal, atraumatic, no cyanosis or edema Pulses: 2+ and symmetric Skin: Skin color, texture, turgor normal. No rashes or lesions Neurologic: Grossly normal  EKG sinus bradycardia at 53 without ST or T wave changes.  I personally reviewed this EKG.  ASSESSMENT AND PLAN:   CAD - H/O PCI History of CAD status post LAD stenting by Dr. Ellouise Newer 04/07/1996 with a J&J PS-1530 stent.  He had a Myoview stress test performed 01/16/2011 that showed mild to moderate septal ischemia.  He was advised 01/29/2011 revealing high-grade proximal LAD as well as AV groove circumflex disease both of which were stented with resolute drug-eluting stents.  His previously placed LAD stent was widely patent.  He is currently asymptomatic.  HTN (hypertension) History of essential hypertension with blood pressure measured today at 148/76.  He is on amlodipine, Benicar and metoprolol.  Dyslipidemia History of dyslipidemia on atorvastatin with recent lipid profile performed 04/22/2022 revealing total cholesterol of 129, LDL 63 and HDL 46.     Lorretta Harp MD [facility], [facility] 08/25/2022 11:05 AM

## 2022-08-25 NOTE — Assessment & Plan Note (Signed)
History of essential hypertension with blood pressure measured today at 148/76.  He is on amlodipine, Benicar and metoprolol.

## 2022-08-25 NOTE — Assessment & Plan Note (Signed)
History of CAD status post LAD stenting by Dr. Ellouise Newer 04/07/1996 with a J&J PS-1530 stent.  He had a Myoview stress test performed 01/16/2011 that showed mild to moderate septal ischemia.  He was advised 01/29/2011 revealing high-grade proximal LAD as well as AV groove circumflex disease both of which were stented with resolute drug-eluting stents.  His previously placed LAD stent was widely patent.  He is currently asymptomatic.

## 2022-08-25 NOTE — Assessment & Plan Note (Signed)
History of dyslipidemia on atorvastatin with recent lipid profile performed 04/22/2022 revealing total cholesterol of 129, LDL 63 and HDL 46.

## 2022-09-17 DIAGNOSIS — Z23 Encounter for immunization: Secondary | ICD-10-CM | POA: Diagnosis not present

## 2022-09-17 DIAGNOSIS — R801 Persistent proteinuria, unspecified: Secondary | ICD-10-CM | POA: Diagnosis not present

## 2022-09-17 DIAGNOSIS — R5383 Other fatigue: Secondary | ICD-10-CM | POA: Diagnosis not present

## 2022-09-17 DIAGNOSIS — I129 Hypertensive chronic kidney disease with stage 1 through stage 4 chronic kidney disease, or unspecified chronic kidney disease: Secondary | ICD-10-CM | POA: Diagnosis not present

## 2022-09-17 DIAGNOSIS — Z0000 Encounter for general adult medical examination without abnormal findings: Secondary | ICD-10-CM | POA: Diagnosis not present

## 2022-09-17 DIAGNOSIS — E785 Hyperlipidemia, unspecified: Secondary | ICD-10-CM | POA: Diagnosis not present

## 2022-09-17 DIAGNOSIS — N182 Chronic kidney disease, stage 2 (mild): Secondary | ICD-10-CM | POA: Diagnosis not present

## 2022-09-17 DIAGNOSIS — R7309 Other abnormal glucose: Secondary | ICD-10-CM | POA: Diagnosis not present

## 2022-09-17 DIAGNOSIS — I2510 Atherosclerotic heart disease of native coronary artery without angina pectoris: Secondary | ICD-10-CM | POA: Diagnosis not present

## 2022-09-24 DIAGNOSIS — N182 Chronic kidney disease, stage 2 (mild): Secondary | ICD-10-CM | POA: Diagnosis not present

## 2022-09-24 DIAGNOSIS — R7309 Other abnormal glucose: Secondary | ICD-10-CM | POA: Diagnosis not present

## 2022-09-24 DIAGNOSIS — I129 Hypertensive chronic kidney disease with stage 1 through stage 4 chronic kidney disease, or unspecified chronic kidney disease: Secondary | ICD-10-CM | POA: Diagnosis not present

## 2022-09-24 DIAGNOSIS — I2510 Atherosclerotic heart disease of native coronary artery without angina pectoris: Secondary | ICD-10-CM | POA: Diagnosis not present

## 2022-09-24 DIAGNOSIS — R801 Persistent proteinuria, unspecified: Secondary | ICD-10-CM | POA: Diagnosis not present

## 2022-09-24 DIAGNOSIS — E785 Hyperlipidemia, unspecified: Secondary | ICD-10-CM | POA: Diagnosis not present

## 2023-04-01 DIAGNOSIS — I129 Hypertensive chronic kidney disease with stage 1 through stage 4 chronic kidney disease, or unspecified chronic kidney disease: Secondary | ICD-10-CM | POA: Diagnosis not present

## 2023-04-01 DIAGNOSIS — R801 Persistent proteinuria, unspecified: Secondary | ICD-10-CM | POA: Diagnosis not present

## 2023-04-01 DIAGNOSIS — R7309 Other abnormal glucose: Secondary | ICD-10-CM | POA: Diagnosis not present

## 2023-04-01 DIAGNOSIS — E785 Hyperlipidemia, unspecified: Secondary | ICD-10-CM | POA: Diagnosis not present

## 2023-04-01 DIAGNOSIS — I2510 Atherosclerotic heart disease of native coronary artery without angina pectoris: Secondary | ICD-10-CM | POA: Diagnosis not present

## 2023-04-01 DIAGNOSIS — N182 Chronic kidney disease, stage 2 (mild): Secondary | ICD-10-CM | POA: Diagnosis not present

## 2023-04-08 DIAGNOSIS — I129 Hypertensive chronic kidney disease with stage 1 through stage 4 chronic kidney disease, or unspecified chronic kidney disease: Secondary | ICD-10-CM | POA: Diagnosis not present

## 2023-04-08 DIAGNOSIS — E785 Hyperlipidemia, unspecified: Secondary | ICD-10-CM | POA: Diagnosis not present

## 2023-04-08 DIAGNOSIS — R7309 Other abnormal glucose: Secondary | ICD-10-CM | POA: Diagnosis not present

## 2023-04-08 DIAGNOSIS — R801 Persistent proteinuria, unspecified: Secondary | ICD-10-CM | POA: Diagnosis not present

## 2023-04-08 DIAGNOSIS — N182 Chronic kidney disease, stage 2 (mild): Secondary | ICD-10-CM | POA: Diagnosis not present

## 2023-04-08 DIAGNOSIS — I2510 Atherosclerotic heart disease of native coronary artery without angina pectoris: Secondary | ICD-10-CM | POA: Diagnosis not present

## 2023-05-01 DIAGNOSIS — Z23 Encounter for immunization: Secondary | ICD-10-CM | POA: Diagnosis not present

## 2023-08-16 ENCOUNTER — Other Ambulatory Visit: Payer: Self-pay

## 2023-08-16 ENCOUNTER — Emergency Department (HOSPITAL_COMMUNITY): Payer: Medicare Other

## 2023-08-16 ENCOUNTER — Encounter (HOSPITAL_COMMUNITY): Payer: Self-pay

## 2023-08-16 ENCOUNTER — Emergency Department (HOSPITAL_COMMUNITY)
Admission: EM | Admit: 2023-08-16 | Discharge: 2023-08-17 | Disposition: A | Discharge status: Home / Self Care | Payer: Medicare Other | Attending: Emergency Medicine | Admitting: Emergency Medicine

## 2023-08-16 DIAGNOSIS — M5021 Other cervical disc displacement,  high cervical region: Secondary | ICD-10-CM | POA: Diagnosis not present

## 2023-08-16 DIAGNOSIS — Z79899 Other long term (current) drug therapy: Secondary | ICD-10-CM | POA: Diagnosis not present

## 2023-08-16 DIAGNOSIS — I2510 Atherosclerotic heart disease of native coronary artery without angina pectoris: Secondary | ICD-10-CM | POA: Insufficient documentation

## 2023-08-16 DIAGNOSIS — M50221 Other cervical disc displacement at C4-C5 level: Secondary | ICD-10-CM | POA: Diagnosis not present

## 2023-08-16 DIAGNOSIS — R202 Paresthesia of skin: Secondary | ICD-10-CM | POA: Insufficient documentation

## 2023-08-16 DIAGNOSIS — M5412 Radiculopathy, cervical region: Secondary | ICD-10-CM | POA: Diagnosis not present

## 2023-08-16 DIAGNOSIS — M50223 Other cervical disc displacement at C6-C7 level: Secondary | ICD-10-CM | POA: Diagnosis not present

## 2023-08-16 DIAGNOSIS — R200 Anesthesia of skin: Secondary | ICD-10-CM | POA: Insufficient documentation

## 2023-08-16 DIAGNOSIS — I10 Essential (primary) hypertension: Secondary | ICD-10-CM | POA: Insufficient documentation

## 2023-08-16 DIAGNOSIS — M4802 Spinal stenosis, cervical region: Secondary | ICD-10-CM | POA: Diagnosis not present

## 2023-08-16 NOTE — ED Triage Notes (Signed)
Pt reports intermittent L hand numbness/coldness/tingling x3 days. Pt reports that he has h/x pinched nerve in his neck.

## 2023-08-16 NOTE — ED Provider Triage Note (Cosign Needed)
Emergency Medicine Provider Triage Evaluation Note  DRAYSON DORKO , a 78 y.o. male  was evaluated in triage.  Pt complains of L hand/arm intermittent numbness x 3 days. Hx of pinched nerve in neck. Symptoms made worse with raising the L arm up over head.   Review of Systems  Positive: As above Negative:   Physical Exam  Ht 5' 10.5" (1.791 m)   Wt 78.9 kg   BMI 24.61 kg/m  Gen:   Awake, no distress   Resp:  Normal effort  MSK:   Moves extremities without difficulty  Other:    Medical Decision Making  Medically screening exam initiated at 3:30 PM.  Appropriate orders placed.  Owen Pratte Laraia was informed that the remainder of the evaluation will be completed by another provider, this initial triage assessment does not replace that evaluation, and the importance of remaining in the ED until their evaluation is complete.  CT cervical spine ordered. Doesn't clinically sound like ACS as symptoms reproducible with movement, no CP or SOB   Mehki Klumpp T, PA-C 08/16/23 1530

## 2023-08-17 ENCOUNTER — Emergency Department (HOSPITAL_COMMUNITY): Payer: Medicare Other

## 2023-08-17 DIAGNOSIS — M4802 Spinal stenosis, cervical region: Secondary | ICD-10-CM | POA: Diagnosis not present

## 2023-08-17 DIAGNOSIS — M5021 Other cervical disc displacement,  high cervical region: Secondary | ICD-10-CM | POA: Diagnosis not present

## 2023-08-17 DIAGNOSIS — M50223 Other cervical disc displacement at C6-C7 level: Secondary | ICD-10-CM | POA: Diagnosis not present

## 2023-08-17 DIAGNOSIS — R200 Anesthesia of skin: Secondary | ICD-10-CM | POA: Diagnosis not present

## 2023-08-17 DIAGNOSIS — M50221 Other cervical disc displacement at C4-C5 level: Secondary | ICD-10-CM | POA: Diagnosis not present

## 2023-08-17 LAB — BASIC METABOLIC PANEL
Anion gap: 11 (ref 5–15)
BUN: 16 mg/dL (ref 8–23)
CO2: 24 mmol/L (ref 22–32)
Calcium: 9.7 mg/dL (ref 8.9–10.3)
Chloride: 102 mmol/L (ref 98–111)
Creatinine, Ser: 0.95 mg/dL (ref 0.61–1.24)
GFR, Estimated: >60 mL/min (ref >60)
Glucose, Bld: 140 mg/dL — ABNORMAL HIGH (ref 70–99)
Potassium: 4.1 mmol/L (ref 3.5–5.1)
Sodium: 137 mmol/L (ref 135–145)

## 2023-08-17 LAB — CBC WITH DIFFERENTIAL/PLATELET
Abs Immature Granulocytes: 0.01 10*3/uL (ref 0.00–0.07)
Basophils Absolute: 0 10*3/uL (ref 0.0–0.1)
Basophils Relative: 1 %
Eosinophils Absolute: 0.2 10*3/uL (ref 0.0–0.5)
Eosinophils Relative: 2 %
HCT: 51.8 % (ref 39.0–52.0)
Hemoglobin: 17.4 g/dL — ABNORMAL HIGH (ref 13.0–17.0)
Immature Granulocytes: 0 %
Lymphocytes Relative: 29 %
Lymphs Abs: 1.9 10*3/uL (ref 0.7–4.0)
MCH: 31.6 pg (ref 26.0–34.0)
MCHC: 33.6 g/dL (ref 30.0–36.0)
MCV: 94.2 fL (ref 80.0–100.0)
Monocytes Absolute: 0.6 10*3/uL (ref 0.1–1.0)
Monocytes Relative: 10 %
Neutro Abs: 3.9 10*3/uL (ref 1.7–7.7)
Neutrophils Relative %: 58 %
Platelets: 221 10*3/uL (ref 150–400)
RBC: 5.5 MIL/uL (ref 4.22–5.81)
RDW: 13.4 % (ref 11.5–15.5)
WBC: 6.6 10*3/uL (ref 4.0–10.5)
nRBC: 0 % (ref 0.0–0.2)

## 2023-08-17 LAB — TROPONIN I (HIGH SENSITIVITY)
Troponin I (High Sensitivity): 16 ng/L (ref <18)
Troponin I (High Sensitivity): 12 ng/L (ref <18)

## 2023-08-17 MED ORDER — METHOCARBAMOL 500 MG PO TABS: 500.0000 mg | ORAL_TABLET | Freq: Three times a day (TID) | ORAL | 0 refills | Status: AC | PRN

## 2023-08-17 MED ORDER — METHYLPREDNISOLONE 4 MG PO TBPK: ORAL_TABLET | ORAL | 0 refills | Status: AC

## 2023-08-17 MED ORDER — METHOCARBAMOL 500 MG PO TABS
500.0000 mg | ORAL_TABLET | Freq: Once | ORAL | Status: AC
  Administered 2023-08-17: 500 mg via ORAL
  Filled 2023-08-17: qty 1

## 2023-08-17 MED ORDER — PREDNISONE 20 MG PO TABS
60.0000 mg | ORAL_TABLET | Freq: Once | ORAL | Status: AC
Start: 2023-08-17 — End: 2023-08-17
  Administered 2023-08-17: 60 mg via ORAL
  Filled 2023-08-17: qty 3

## 2023-08-17 NOTE — ED Provider Notes (Signed)
 [place] EMERGENCY DEPARTMENT AT [facility] Provider Note   CSN: [id number] Arrival date & time: 08/16/23  1442     History  Chief Complaint  Patient presents with   Numbness    Evan [name] is a 78 y.o. male.  Patient with a history of hyper tension, CAD with stents, prediabetes, sleep apnea, vertigo presenting with intermittent tingling and numbness to his left hand for the past 3 days.  States when he wakes up in the morning there is tingling and numbness to his left hand but does not extend past his wrist.  This improves throughout the day as he gets moving.  Denies any weakness in the hand.  No numbness in the hand currently.  He is concerned that this could be from his heart as he has had multiple stents in the past but at that time had chest pain and pressure.  Denies chest pain or pressure today.  Denies headache, significant neck pain, weakness in the hand.  No visual changes.  No abdominal pain, nausea, vomiting or fever.  Has a history of a bulging disc in the neck in the past without surgery.  No weakness in his legs.  No difficulty speaking or difficulty swallowing.  Denies any numbness currently but has intermittent numbness for the past several days it seems to be worse with raising his arm over his head and better with movement.  Hand feels cold. This is dissimilar to his previous heart type pain  The history is provided by the patient.       Home Medications Prior to Admission medications   Medication Sig Start Date End Date Taking? Authorizing Provider  amLODipine (NORVASC) 5 MG tablet Take 2.5 mg by mouth in the morning and at bedtime.    [provider]  atorvastatin (LIPITOR) 20 MG tablet Take 20 mg by mouth every evening.    [provider]  Calcium Carb-Cholecalciferol (CALCIUM 500/VITAMIN D PO) Take 1 tablet by mouth in the morning and at bedtime.    [provider]  Coenzyme Q10 (COQ10) 100 MG CAPS Take 100 mg by  mouth every evening.    [provider]  Melatonin 10 MG TABS Take 10 mg by mouth at bedtime as needed (sleep).    [provider]  metoprolol  tartrate (LOPRESSOR ) 25 MG tablet Take 0.5 tablets (12.5 mg total) by mouth 2 (two) times daily. Patient taking differently: Take 25 mg by mouth 2 (two) times daily. 06/11/17   Court Dorn PARAS, MD  Multiple Vitamin (MULTIVITAMIN) tablet Take 1 tablet by mouth daily.    [provider]  olmesartan (BENICAR) 40 MG tablet Take 20 mg by mouth in the morning and at bedtime. 05/29/21   [provider]  Omega-3 Fatty Acids (FISH OIL) 1200 MG CAPS Take 1,200 mg by mouth daily.    [provider]      Allergies    Plavix [clopidogrel bisulfate]    Review of Systems   Review of Systems  Constitutional:  Negative for activity change, appetite change and fever.  HENT:  Negative for congestion and rhinorrhea.   Respiratory:  Negative for cough and shortness of breath.   Cardiovascular:  Negative for chest pain.  Gastrointestinal:  Negative for abdominal pain, nausea and vomiting.  Genitourinary:  Negative for dysuria and hematuria.  Musculoskeletal:  Negative for arthralgias and myalgias.  Neurological:  Positive for numbness. Negative for dizziness, facial asymmetry, weakness and light-headedness.   all other systems  are negative except as noted in the HPI and PMH.    Physical Exam Updated Vital Signs BP 139/80   Pulse 67   Temp 97.8 F (36.6 C)   Resp 18   Ht 5' 10.5 (1.791 m)   Wt 78.9 kg   SpO2 100%   BMI 24.61 kg/m  Physical Exam Vitals and nursing note reviewed.  Constitutional:      General: He is not in acute distress.    Appearance: He is well-developed.  HENT:     Head: Normocephalic and atraumatic.     Mouth/Throat:     Pharynx: No oropharyngeal exudate.  Eyes:     Conjunctiva/sclera: Conjunctivae normal.     Pupils: Pupils are equal, round, and reactive to light.  Neck:      Comments: No meningismus. Cardiovascular:     Rate and Rhythm: Normal rate and regular rhythm.     Heart sounds: Normal heart sounds. No murmur heard. Pulmonary:     Effort: Pulmonary effort is normal. No respiratory distress.     Breath sounds: Normal breath sounds.  Abdominal:     Palpations: Abdomen is soft.     Tenderness: There is no abdominal tenderness. There is no guarding or rebound.  Musculoskeletal:        General: No tenderness. Normal range of motion.     Cervical back: Normal range of motion and neck supple.     Comments: Equal grip strength.  Equal cardinal hand movements.  Equal radial pulses bilaterally  Skin:    General: Skin is warm.  Neurological:     Mental Status: He is alert and oriented to person, place, and time.     Cranial Nerves: No cranial nerve deficit.     Motor: No abnormal muscle tone.     Coordination: Coordination normal.     Comments: No ataxia on finger to nose bilaterally. No pronator drift. 5/5 strength throughout. CN 2-12 intact.Equal grip strength. Sensation intact.   Psychiatric:        Behavior: Behavior normal.     ED Results / Procedures / Treatments   Labs (all labs ordered are listed, but only abnormal results are displayed) Labs Reviewed  CBC WITH DIFFERENTIAL/PLATELET - Abnormal; Notable for the following components:      Result Value   Hemoglobin 17.4 (*)    All other components within normal limits  BASIC METABOLIC PANEL - Abnormal; Notable for the following components:   Glucose, Bld 140 (*)    All other components within normal limits  TROPONIN I (HIGH SENSITIVITY)  TROPONIN I (HIGH SENSITIVITY)    EKG EKG Interpretation Date/Time:  Tuesday August 17 2023 00:24:42 EST Ventricular Rate:  81 PR Interval:  158 QRS Duration:  86 QT Interval:  388 QTC Calculation: 450 R Axis:   88  Text Interpretation: Normal sinus rhythm Cannot rule out Anteroseptal infarct , age undetermined Abnormal ECG When compared with ECG of  22-Jan-2021 01:49, PREVIOUS ECG IS PRESENT No significant change was found Confirmed by Carita Senior [phone]) on 08/17/2023 12:29:38 AM  Radiology MR BRAIN WO CONTRAST Result Date: 08/17/2023 CLINICAL DATA:  Left hand numbness EXAM: MRI HEAD WITHOUT CONTRAST MRI CERVICAL SPINE WITHOUT CONTRAST TECHNIQUE: Multiplanar, multiecho pulse sequences of the brain and surrounding structures, and cervical spine, to include the craniocervical junction and cervicothoracic junction, were obtained without intravenous contrast. COMPARISON:  None Available. FINDINGS: MRI HEAD FINDINGS Brain: No acute infarct, mass effect or extra-axial collection. No acute or chronic hemorrhage. Minimal  multifocal hyperintense T2-weight signal within the white matter. The midline structures are normal. Vascular: Normal flow voids. Skull and upper cervical spine: Normal calvarium and skull base. Visualized upper cervical spine and soft tissues are normal. Sinuses/Orbits:No paranasal sinus fluid levels or advanced mucosal thickening. No mastoid or middle ear effusion. Normal orbits. MRI CERVICAL SPINE FINDINGS Alignment: Grade 1 retrolisthesis at C4-5 Vertebrae: No fracture, evidence of discitis, or bone lesion. Cord: Normal signal and morphology. Posterior Fossa, vertebral arteries, paraspinal tissues: Negative. Disc levels: C1-2: Unremarkable. C2-3: Normal disc space and facet joints. There is no spinal canal stenosis. No neural foraminal stenosis. C3-4: Small disc bulge with bilateral uncovertebral hypertrophy and mild facet hypertrophy. There is no spinal canal stenosis. Severe bilateral neural foraminal stenosis. C4-5: Small disc bulge with uncovertebral hypertrophy. Mild spinal canal stenosis. Severe bilateral neural foraminal stenosis. C5-6: Small central osteophyte indenting the ventral thecal sac. There is no spinal canal stenosis. No neural foraminal stenosis. C6-7: Right asymmetric disc bulge with right-greater-than-left uncovertebral  hypertrophy. There is no spinal canal stenosis. Severe right and mild left neural foraminal stenosis. C7-T1: Normal disc space and facet joints. There is no spinal canal stenosis. No neural foraminal stenosis. IMPRESSION: 1. No acute intracranial abnormality. 2. Severe bilateral C3-4 and C4-5 neural foraminal stenosis. 3. Severe right C6-7 neural foraminal stenosis. 4. Mild C4-5 spinal canal stenosis. Electronically Signed   By: Franky Stanford M.D.   On: 08/17/2023 01:34   MR Cervical Spine Wo Contrast Result Date: 08/17/2023 CLINICAL DATA:  Left hand numbness EXAM: MRI HEAD WITHOUT CONTRAST MRI CERVICAL SPINE WITHOUT CONTRAST TECHNIQUE: Multiplanar, multiecho pulse sequences of the brain and surrounding structures, and cervical spine, to include the craniocervical junction and cervicothoracic junction, were obtained without intravenous contrast. COMPARISON:  None Available. FINDINGS: MRI HEAD FINDINGS Brain: No acute infarct, mass effect or extra-axial collection. No acute or chronic hemorrhage. Minimal multifocal hyperintense T2-weight signal within the white matter. The midline structures are normal. Vascular: Normal flow voids. Skull and upper cervical spine: Normal calvarium and skull base. Visualized upper cervical spine and soft tissues are normal. Sinuses/Orbits:No paranasal sinus fluid levels or advanced mucosal thickening. No mastoid or middle ear effusion. Normal orbits. MRI CERVICAL SPINE FINDINGS Alignment: Grade 1 retrolisthesis at C4-5 Vertebrae: No fracture, evidence of discitis, or bone lesion. Cord: Normal signal and morphology. Posterior Fossa, vertebral arteries, paraspinal tissues: Negative. Disc levels: C1-2: Unremarkable. C2-3: Normal disc space and facet joints. There is no spinal canal stenosis. No neural foraminal stenosis. C3-4: Small disc bulge with bilateral uncovertebral hypertrophy and mild facet hypertrophy. There is no spinal canal stenosis. Severe bilateral neural foraminal  stenosis. C4-5: Small disc bulge with uncovertebral hypertrophy. Mild spinal canal stenosis. Severe bilateral neural foraminal stenosis. C5-6: Small central osteophyte indenting the ventral thecal sac. There is no spinal canal stenosis. No neural foraminal stenosis. C6-7: Right asymmetric disc bulge with right-greater-than-left uncovertebral hypertrophy. There is no spinal canal stenosis. Severe right and mild left neural foraminal stenosis. C7-T1: Normal disc space and facet joints. There is no spinal canal stenosis. No neural foraminal stenosis. IMPRESSION: 1. No acute intracranial abnormality. 2. Severe bilateral C3-4 and C4-5 neural foraminal stenosis. 3. Severe right C6-7 neural foraminal stenosis. 4. Mild C4-5 spinal canal stenosis. Electronically Signed   By: Franky Stanford M.D.   On: 08/17/2023 01:34   CT Cervical Spine Wo Contrast Result Date: 08/16/2023 CLINICAL DATA:  Cervical radiculopathy, no red flags EXAM: CT CERVICAL SPINE WITHOUT CONTRAST TECHNIQUE: Multidetector CT imaging of the cervical spine  was performed without intravenous contrast. Multiplanar CT image reconstructions were also generated. RADIATION DOSE REDUCTION: This exam was performed according to the departmental dose-optimization program which includes automated exposure control, adjustment of the mA and/or kV according to patient size and/or use of iterative reconstruction technique. COMPARISON:  None Available. FINDINGS: Alignment: Normal. Skull base and vertebrae: No acute fracture. Vertebral heights are maintained. Soft tissues and spinal canal: No prevertebral fluid or swelling. No visible canal hematoma. Disc levels: Multilevel facet and uncovertebral hypertrophy with varying degrees of neural foraminal stenosis, potentially severe on the right at C3-C4 and C6-C7 and bilaterally at C4-C5. Likely mild canal stenosis at multiple levels. No high-grade bony canal stenosis. Upper chest: Visualized lung apices are clear. IMPRESSION: 1.  Multilevel facet and uncovertebral hypertrophy with varying degrees of neural foraminal stenosis, potentially severe on the right at C3-C4 and C6-C7 and bilaterally at C4-C5. An MRI could better characterize if clinically warranted. 2. Likely mild canal stenosis at multiple levels. No high-grade bony canal stenosis. 3. No evidence of acute fracture or malalignment. Electronically Signed   By: Gilmore GORMAN Molt M.D.   On: 08/16/2023 17:15    Procedures Procedures    Medications Ordered in ED Medications  predniSONE  (DELTASONE ) tablet 60 mg (has no administration in time range)  methocarbamol  (ROBAXIN ) tablet 500 mg (has no administration in time range)    ED Course/ Medical Decision Making/ A&P                                 Medical Decision Making Amount and/or Complexity of Data Reviewed Labs: ordered. Decision-making details documented in ED Course. Radiology: ordered and independent interpretation performed. Decision-making details documented in ED Course. ECG/medicine tests: ordered and independent interpretation performed. Decision-making details documented in ED Course.  Risk Prescription drug management.   Intermittent numbness to the left hand for the past several days.  None currently.  Intact distal strength.  No chest pain or shortness of breath.  He is concerned for possible ACS.  Will obtain EKG and labs.  This does not sound like ACS however.  CT scan in triage is reviewed.  CT shows foraminal stenosis bilaterally and facet hypertrophy throughout.  EKG is unchanged.  Low suspicion for ACS.  Troponin negative.  MRI brain and C-spine obtained for further evaluation of his intermittent left arm numbness.  MRI brain is negative for infarct.  MRI C-spine shows multilevel foraminal stenosis bilaterally.  EKG is unchanged.  Troponin negative x 2.  Low suspicion for ACS.  MRI is concerning for multilevel foraminal stenosis bilaterally without obvious cord  effects.  Will give course of steroids and muscle relaxers for presumed cervical radiculopathy.  Follow-up with PCP as well as neurosurgery.  Cautioned to monitor blood sugars closely while taking the steroids.  Return to the ED with weakness, numbness, tingling, exertional chest pain, shortness of breath, nausea, vomiting, sweating or other concerns.        Final Clinical Impression(s) / ED Diagnoses Final diagnoses:  Left arm numbness    Rx / DC Orders ED Discharge Orders     None         Zaylin Pistilli, Garnette, MD 08/17/23 [phone]

## 2023-08-17 NOTE — Discharge Instructions (Addendum)
 There is no sign of a stroke.  Your MRI did show some areas of narrowing and bulging disc in your neck which may be contributing to your arm numbness.  No signs of a heart attack.  You were given steroids to help reduce some of the inflammation in your neck.  Watch your blood sugars carefully while taking the steroids as they may be increased.  You should follow-up with your primary doctor as well as the spine doctor for further evaluation of your numbness to your arm.  Return to the ED with weakness, chest pain, difficulty breathing, any other concerns.

## 2023-08-17 NOTE — ED Notes (Signed)
 Patient transported to MRI

## 2023-08-19 DIAGNOSIS — N182 Chronic kidney disease, stage 2 (mild): Secondary | ICD-10-CM | POA: Diagnosis not present

## 2023-08-19 DIAGNOSIS — I129 Hypertensive chronic kidney disease with stage 1 through stage 4 chronic kidney disease, or unspecified chronic kidney disease: Secondary | ICD-10-CM | POA: Diagnosis not present

## 2023-08-19 DIAGNOSIS — R7309 Other abnormal glucose: Secondary | ICD-10-CM | POA: Diagnosis not present

## 2023-08-19 DIAGNOSIS — E785 Hyperlipidemia, unspecified: Secondary | ICD-10-CM | POA: Diagnosis not present

## 2023-08-19 DIAGNOSIS — M5412 Radiculopathy, cervical region: Secondary | ICD-10-CM | POA: Diagnosis not present

## 2023-08-19 DIAGNOSIS — I2510 Atherosclerotic heart disease of native coronary artery without angina pectoris: Secondary | ICD-10-CM | POA: Diagnosis not present

## 2023-09-06 DIAGNOSIS — E785 Hyperlipidemia, unspecified: Secondary | ICD-10-CM | POA: Diagnosis not present

## 2023-09-06 DIAGNOSIS — N182 Chronic kidney disease, stage 2 (mild): Secondary | ICD-10-CM | POA: Diagnosis not present

## 2023-09-06 DIAGNOSIS — M5412 Radiculopathy, cervical region: Secondary | ICD-10-CM | POA: Diagnosis not present

## 2023-09-06 DIAGNOSIS — I2510 Atherosclerotic heart disease of native coronary artery without angina pectoris: Secondary | ICD-10-CM | POA: Diagnosis not present

## 2023-09-06 DIAGNOSIS — R7309 Other abnormal glucose: Secondary | ICD-10-CM | POA: Diagnosis not present

## 2023-09-06 DIAGNOSIS — I129 Hypertensive chronic kidney disease with stage 1 through stage 4 chronic kidney disease, or unspecified chronic kidney disease: Secondary | ICD-10-CM | POA: Diagnosis not present

## 2023-09-07 DIAGNOSIS — M6281 Muscle weakness (generalized): Secondary | ICD-10-CM | POA: Diagnosis not present

## 2023-09-07 DIAGNOSIS — M5412 Radiculopathy, cervical region: Secondary | ICD-10-CM | POA: Diagnosis not present

## 2023-09-13 DIAGNOSIS — M6281 Muscle weakness (generalized): Secondary | ICD-10-CM | POA: Diagnosis not present

## 2023-09-13 DIAGNOSIS — M5412 Radiculopathy, cervical region: Secondary | ICD-10-CM | POA: Diagnosis not present

## 2023-09-14 ENCOUNTER — Encounter: Payer: Self-pay | Admitting: Cardiovascular Disease

## 2023-09-14 ENCOUNTER — Ambulatory Visit: Payer: Medicare Other | Attending: Cardiovascular Disease | Admitting: Cardiovascular Disease

## 2023-09-14 VITALS — BP 120/78 | HR 56 | Ht 70.5 in | Wt 176.0 lb

## 2023-09-14 DIAGNOSIS — I2510 Atherosclerotic heart disease of native coronary artery without angina pectoris: Secondary | ICD-10-CM | POA: Insufficient documentation

## 2023-09-14 DIAGNOSIS — E785 Hyperlipidemia, unspecified: Secondary | ICD-10-CM | POA: Insufficient documentation

## 2023-09-14 DIAGNOSIS — I10 Essential (primary) hypertension: Secondary | ICD-10-CM | POA: Diagnosis not present

## 2023-09-14 NOTE — Assessment & Plan Note (Signed)
 History of essential hypertension her blood pressure measured today at 120/78.  He is on amlodipine, metoprolol and Benicar.

## 2023-09-14 NOTE — Assessment & Plan Note (Signed)
 History of dyslipidemia on atorvastatin with lipid profile performed 04/01/2023 revealing total cholesterol 162, LDL of 89 and HDL of 55.  He is not at goal for secondary prevention.  He will have this rechecked by his PCP in April of this year.

## 2023-09-14 NOTE — Patient Instructions (Signed)
 Medication Instructions:  Your physician recommends that you continue on your current medications as directed. Please refer to the Current Medication list given to you today.  *If you need a refill on your cardiac medications before your next appointment, please call your pharmacy*   Lab Work: NONE ordered at this time of appointment   Testing/Procedures: NONE ordered at this time of appointment   Follow-Up: At [facility], you and your health needs are our priority.  As part of our continuing mission to provide you with exceptional heart care, we have created designated Provider Care Teams.  These Care Teams include your primary Cardiologist (physician) and Advanced Practice Providers (APPs -  Physician Assistants and Nurse Practitioners) who all work together to provide you with the care you need, when you need it.  We recommend signing up for the patient portal called "MyChart".  Sign up information is provided on this After Visit Summary.  MyChart is used to connect with patients for Virtual Visits (Telemedicine).  Patients are able to view lab/test results, encounter notes, upcoming appointments, etc.  Non-urgent messages can be sent to your provider as well.   To learn more about what you can do with MyChart, go to [email or web address].    Your next appointment:   1 year(s)  Provider:    Dr. Allyson Sabal

## 2023-09-14 NOTE — Progress Notes (Signed)
 09/14/2023 Evan [name]   08/20/1945  [id number]  Primary Physician Georgianne Fick, MD Primary Cardiologist: Runell Gess MD Nicholes Calamity, [place]  HPI:  Evan [name] is a 78 y.o.  thin-appearing, married Asian male, father of 2, grandfather to 2 grandchildren who is retired from [employer]. I last saw him 08/25/2022. His wife was a Teacher, early years/pre at [employer], and recently retired.  He has a history of CAD status post LAD stenting by Dr. Daphene Jaeger April 07, 1996, with a J&J PS-1530 stent. 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 Tonny Bollman at [facility] in 2010. He did have a follow up evaluation there 2 yrs later and was released from their service.. Because of complaints of an uncomfortable pressure sensation in his chest, a Myoview stress test performed January 16, 2011, showed mild to moderate septal ischemia. He was catheterized January 29, 2011, revealing high-grade proximal LAD as well as AV groove circumflex disease, both of which were stented with Resolute drug-eluting stents. His previously placed LAD stent was widely patent. He has been asymptomatic since. His other problems include hypertension and hyperlipidemia.  He does travel quite a bit to [place] as well as [place] where his son and grandchildren live.    He went to [place] and [place] and injured his right hip walking up a mountain.  He ultimately underwent total hip replacement 02/24/2022 and is since recovered.  He is working out at [employer] daily.     Since I saw him a year ago he continues to do well.  He he continues to workout without symptoms.  He denies chest pain or shortness of breath.     Current Meds  Medication Sig   amLODipine (NORVASC) 5 MG tablet Take 2.5 mg by mouth in the morning and at bedtime.   aspirin EC 81 MG tablet Take 81 mg by mouth daily.   atorvastatin (LIPITOR) 20 MG tablet Take 20 mg by mouth every evening.    Calcium Carb-Cholecalciferol (CALCIUM 500/VITAMIN D PO) Take 1 tablet by mouth in the morning and at bedtime.   Coenzyme Q10 (COQ10) 100 MG CAPS Take 100 mg by mouth every evening.   Melatonin 10 MG TABS Take 10 mg by mouth at bedtime as needed (sleep).   methylPREDNISolone (MEDROL DOSEPAK) 4 MG TBPK tablet As directed   metoprolol tartrate (LOPRESSOR) 25 MG tablet Take 0.5 tablets (12.5 mg total) by mouth 2 (two) times daily.   Multiple Vitamin (MULTIVITAMIN) tablet Take 1 tablet by mouth daily.   olmesartan (BENICAR) 40 MG tablet Take 20 mg by mouth in the morning and at bedtime.   Omega-3 Fatty Acids (FISH OIL) 1200 MG CAPS Take 1,200 mg by mouth daily.     Allergies  Allergen Reactions   Plavix [Clopidogrel Bisulfate]     Possible Rash     Social History   Socioeconomic History   Marital status: Married    Spouse name: Not on file   Number of children: Not on file   Years of education: Not on file   Highest education level: Not on file  Occupational History   Not on file  Tobacco Use   Smoking status: Never   Smokeless tobacco: Never  Vaping Use   Vaping status: Never Used  Substance and Sexual Activity   Alcohol use: Yes    Comment: rarely   Drug use: No   Sexual activity:  Not Currently  Other Topics Concern   Not on file  Social History Narrative   Not on file   Social Drivers of Health   Financial Resource Strain: Not on file  Food Insecurity: Not on file  Transportation Needs: Not on file  Physical Activity: Not on file  Stress: Not on file  Social Connections: Not on file  Intimate Partner Violence: Not on file     Review of Systems: General: negative for chills, fever, night sweats or weight changes.  Cardiovascular: negative for chest pain, dyspnea on exertion, edema, orthopnea, palpitations, paroxysmal nocturnal dyspnea or shortness of breath Dermatological: negative for rash Respiratory: negative for cough or wheezing Urologic: negative for  hematuria Abdominal: negative for nausea, vomiting, diarrhea, bright red blood per rectum, melena, or hematemesis Neurologic: negative for visual changes, syncope, or dizziness All other systems reviewed and are otherwise negative except as noted above.    Blood pressure 120/78, pulse (!) 56, height 5' 10.5" (1.791 m), weight 176 lb (79.8 kg), SpO2 99%.  General appearance: alert and no distress Neck: no adenopathy, no carotid bruit, no JVD, supple, symmetrical, trachea midline, and thyroid not enlarged, symmetric, no tenderness/mass/nodules Lungs: clear to auscultation bilaterally Heart: regular rate and rhythm, S1, S2 normal, no murmur, click, rub or gallop Extremities: extremities normal, atraumatic, no cyanosis or edema Pulses: 2+ and symmetric Skin: Skin color, texture, turgor normal. No rashes or lesions Neurologic: Grossly normal  EKG not performed today      ASSESSMENT AND PLAN:   CAD - H/O PCI History of CAD status post LAD stenting by Dr. Tresa Endo 04/07/1996 with a J&J PS/1530 stent.  I catheterized him 01/29/2011 revealing high-grade proximal LAD as well as moderate AV groove circumflex disease both of which were stented with resolute drug-eluting stents.  His previously placed LAD stent was widely patent.  He is active and completely asymptomatic.  HTN (hypertension) History of essential hypertension her blood pressure measured today at 120/78.  He is on amlodipine, metoprolol and Benicar.  Dyslipidemia History of dyslipidemia on atorvastatin with lipid profile performed 04/01/2023 revealing total cholesterol 162, LDL of 89 and HDL of 55.  He is not at goal for secondary prevention.  He will have this rechecked by his PCP in April of this year.     Runell Gess MD FACP,FACC,FAHA, [facility] 09/14/2023 10:52 AM

## 2023-09-14 NOTE — Assessment & Plan Note (Signed)
 History of CAD status post LAD stenting by Dr. Tresa Endo 04/07/1996 with a J&J PS/1530 stent.  I catheterized him 01/29/2011 revealing high-grade proximal LAD as well as moderate AV groove circumflex disease both of which were stented with resolute drug-eluting stents.  His previously placed LAD stent was widely patent.  He is active and completely asymptomatic.

## 2023-09-20 DIAGNOSIS — M6281 Muscle weakness (generalized): Secondary | ICD-10-CM | POA: Diagnosis not present

## 2023-09-20 DIAGNOSIS — M5412 Radiculopathy, cervical region: Secondary | ICD-10-CM | POA: Diagnosis not present

## 2023-09-27 DIAGNOSIS — M5412 Radiculopathy, cervical region: Secondary | ICD-10-CM | POA: Diagnosis not present

## 2023-09-27 DIAGNOSIS — M6281 Muscle weakness (generalized): Secondary | ICD-10-CM | POA: Diagnosis not present

## 2023-10-04 DIAGNOSIS — M5412 Radiculopathy, cervical region: Secondary | ICD-10-CM | POA: Diagnosis not present

## 2023-10-04 DIAGNOSIS — M6281 Muscle weakness (generalized): Secondary | ICD-10-CM | POA: Diagnosis not present

## 2023-10-18 DIAGNOSIS — M5412 Radiculopathy, cervical region: Secondary | ICD-10-CM | POA: Diagnosis not present

## 2023-10-18 DIAGNOSIS — M6281 Muscle weakness (generalized): Secondary | ICD-10-CM | POA: Diagnosis not present

## 2023-10-21 DIAGNOSIS — R801 Persistent proteinuria, unspecified: Secondary | ICD-10-CM | POA: Diagnosis not present

## 2023-10-21 DIAGNOSIS — Z0000 Encounter for general adult medical examination without abnormal findings: Secondary | ICD-10-CM | POA: Diagnosis not present

## 2023-10-21 DIAGNOSIS — N182 Chronic kidney disease, stage 2 (mild): Secondary | ICD-10-CM | POA: Diagnosis not present

## 2023-10-21 DIAGNOSIS — R7309 Other abnormal glucose: Secondary | ICD-10-CM | POA: Diagnosis not present

## 2023-10-21 DIAGNOSIS — R5383 Other fatigue: Secondary | ICD-10-CM | POA: Diagnosis not present

## 2023-10-21 DIAGNOSIS — I129 Hypertensive chronic kidney disease with stage 1 through stage 4 chronic kidney disease, or unspecified chronic kidney disease: Secondary | ICD-10-CM | POA: Diagnosis not present

## 2023-10-21 DIAGNOSIS — E785 Hyperlipidemia, unspecified: Secondary | ICD-10-CM | POA: Diagnosis not present

## 2023-10-21 DIAGNOSIS — I2510 Atherosclerotic heart disease of native coronary artery without angina pectoris: Secondary | ICD-10-CM | POA: Diagnosis not present

## 2023-10-28 DIAGNOSIS — I2510 Atherosclerotic heart disease of native coronary artery without angina pectoris: Secondary | ICD-10-CM | POA: Diagnosis not present

## 2023-10-28 DIAGNOSIS — N182 Chronic kidney disease, stage 2 (mild): Secondary | ICD-10-CM | POA: Diagnosis not present

## 2023-10-28 DIAGNOSIS — E1122 Type 2 diabetes mellitus with diabetic chronic kidney disease: Secondary | ICD-10-CM | POA: Diagnosis not present

## 2023-10-28 DIAGNOSIS — R801 Persistent proteinuria, unspecified: Secondary | ICD-10-CM | POA: Diagnosis not present

## 2023-10-28 DIAGNOSIS — E785 Hyperlipidemia, unspecified: Secondary | ICD-10-CM | POA: Diagnosis not present

## 2023-10-28 DIAGNOSIS — I129 Hypertensive chronic kidney disease with stage 1 through stage 4 chronic kidney disease, or unspecified chronic kidney disease: Secondary | ICD-10-CM | POA: Diagnosis not present

## 2024-01-27 DIAGNOSIS — E785 Hyperlipidemia, unspecified: Secondary | ICD-10-CM | POA: Diagnosis not present

## 2024-01-27 DIAGNOSIS — E1122 Type 2 diabetes mellitus with diabetic chronic kidney disease: Secondary | ICD-10-CM | POA: Diagnosis not present

## 2024-01-27 DIAGNOSIS — I2510 Atherosclerotic heart disease of native coronary artery without angina pectoris: Secondary | ICD-10-CM | POA: Diagnosis not present

## 2024-02-03 DIAGNOSIS — I2510 Atherosclerotic heart disease of native coronary artery without angina pectoris: Secondary | ICD-10-CM | POA: Diagnosis not present

## 2024-02-03 DIAGNOSIS — E1122 Type 2 diabetes mellitus with diabetic chronic kidney disease: Secondary | ICD-10-CM | POA: Diagnosis not present

## 2024-02-03 DIAGNOSIS — I129 Hypertensive chronic kidney disease with stage 1 through stage 4 chronic kidney disease, or unspecified chronic kidney disease: Secondary | ICD-10-CM | POA: Diagnosis not present

## 2024-02-03 DIAGNOSIS — E785 Hyperlipidemia, unspecified: Secondary | ICD-10-CM | POA: Diagnosis not present

## 2024-02-03 DIAGNOSIS — R801 Persistent proteinuria, unspecified: Secondary | ICD-10-CM | POA: Diagnosis not present

## 2024-02-03 DIAGNOSIS — N182 Chronic kidney disease, stage 2 (mild): Secondary | ICD-10-CM | POA: Diagnosis not present

## 2024-04-15 DIAGNOSIS — Z23 Encounter for immunization: Secondary | ICD-10-CM | POA: Diagnosis not present

## 2024-04-24 DIAGNOSIS — Z23 Encounter for immunization: Secondary | ICD-10-CM | POA: Diagnosis not present

## 2024-06-22 DIAGNOSIS — I2510 Atherosclerotic heart disease of native coronary artery without angina pectoris: Secondary | ICD-10-CM | POA: Diagnosis not present

## 2024-06-22 DIAGNOSIS — N182 Chronic kidney disease, stage 2 (mild): Secondary | ICD-10-CM | POA: Diagnosis not present

## 2024-06-22 DIAGNOSIS — R801 Persistent proteinuria, unspecified: Secondary | ICD-10-CM | POA: Diagnosis not present

## 2024-06-22 DIAGNOSIS — E785 Hyperlipidemia, unspecified: Secondary | ICD-10-CM | POA: Diagnosis not present

## 2024-06-22 DIAGNOSIS — I129 Hypertensive chronic kidney disease with stage 1 through stage 4 chronic kidney disease, or unspecified chronic kidney disease: Secondary | ICD-10-CM | POA: Diagnosis not present

## 2024-06-22 DIAGNOSIS — E1122 Type 2 diabetes mellitus with diabetic chronic kidney disease: Secondary | ICD-10-CM | POA: Diagnosis not present

## 2024-09-18 ENCOUNTER — Ambulatory Visit: Admitting: Cardiovascular Disease
